# Patient Record
Sex: Male | Born: 1960 | Race: Black or African American | Hispanic: No | Marital: Single | Smoking: Current every day smoker
Health system: Southern US, Community
[De-identification: ages and names within clinical notes are randomized; demographics above are authoritative.]

## PROBLEM LIST (undated history)

## (undated) DIAGNOSIS — L89513 Pressure ulcer of right ankle, stage 3: Secondary | ICD-10-CM

## (undated) DIAGNOSIS — R6510 Systemic inflammatory response syndrome (SIRS) of non-infectious origin without acute organ dysfunction: Secondary | ICD-10-CM

## (undated) DIAGNOSIS — A419 Sepsis, unspecified organism: Secondary | ICD-10-CM

## (undated) DIAGNOSIS — Z930 Tracheostomy status: Secondary | ICD-10-CM

## (undated) DIAGNOSIS — J9690 Respiratory failure, unspecified, unspecified whether with hypoxia or hypercapnia: Secondary | ICD-10-CM

## (undated) DIAGNOSIS — L89314 Pressure ulcer of right buttock, stage 4: Secondary | ICD-10-CM

## (undated) DIAGNOSIS — I469 Cardiac arrest, cause unspecified: Secondary | ICD-10-CM

## (undated) DIAGNOSIS — J15212 Pneumonia due to Methicillin resistant Staphylococcus aureus: Secondary | ICD-10-CM

## (undated) DIAGNOSIS — G8250 Quadriplegia, unspecified: Secondary | ICD-10-CM

## (undated) DIAGNOSIS — S14105A Unspecified injury at C5 level of cervical spinal cord, initial encounter: Secondary | ICD-10-CM

## (undated) DIAGNOSIS — N390 Urinary tract infection, site not specified: Secondary | ICD-10-CM

## (undated) HISTORY — PX: GASTROSTOMY TUBE PLACEMENT: SHX655

## (undated) HISTORY — PX: SPINAL FUSION: SHX223

## (undated) HISTORY — PX: OTHER SURGICAL HISTORY: SHX169

## (undated) HISTORY — PX: SUPRAPUBIC CATHETER INSERTION: SUR719

---

## 2002-10-15 ENCOUNTER — Ambulatory Visit (HOSPITAL_COMMUNITY): Admission: RE | Admit: 2002-10-15 | Discharge: 2002-10-15 | Payer: Self-pay | Admitting: Internal Medicine

## 2002-10-15 ENCOUNTER — Encounter (HOSPITAL_BASED_OUTPATIENT_CLINIC_OR_DEPARTMENT_OTHER): Payer: Self-pay | Admitting: Internal Medicine

## 2002-10-19 ENCOUNTER — Encounter (HOSPITAL_BASED_OUTPATIENT_CLINIC_OR_DEPARTMENT_OTHER): Payer: Self-pay | Admitting: Internal Medicine

## 2002-10-19 ENCOUNTER — Ambulatory Visit (HOSPITAL_COMMUNITY): Admission: RE | Admit: 2002-10-19 | Discharge: 2002-10-19 | Payer: Self-pay | Admitting: Internal Medicine

## 2002-10-21 ENCOUNTER — Emergency Department (HOSPITAL_COMMUNITY): Admission: EM | Admit: 2002-10-21 | Discharge: 2002-10-22 | Payer: Self-pay | Admitting: Emergency Medicine

## 2002-10-22 ENCOUNTER — Encounter: Payer: Self-pay | Admitting: Emergency Medicine

## 2002-11-03 ENCOUNTER — Encounter: Payer: Self-pay | Admitting: Internal Medicine

## 2002-11-03 ENCOUNTER — Ambulatory Visit (HOSPITAL_COMMUNITY): Admission: RE | Admit: 2002-11-03 | Discharge: 2002-11-03 | Payer: Self-pay | Admitting: Internal Medicine

## 2002-11-05 ENCOUNTER — Encounter: Payer: Self-pay | Admitting: Internal Medicine

## 2002-11-05 ENCOUNTER — Ambulatory Visit (HOSPITAL_COMMUNITY): Admission: RE | Admit: 2002-11-05 | Discharge: 2002-11-05 | Payer: Self-pay | Admitting: Internal Medicine

## 2003-11-12 ENCOUNTER — Emergency Department (HOSPITAL_COMMUNITY): Admission: EM | Admit: 2003-11-12 | Discharge: 2003-11-12 | Payer: Self-pay | Admitting: Emergency Medicine

## 2003-11-17 ENCOUNTER — Encounter (INDEPENDENT_AMBULATORY_CARE_PROVIDER_SITE_OTHER): Payer: Self-pay | Admitting: *Deleted

## 2003-11-17 ENCOUNTER — Ambulatory Visit (HOSPITAL_COMMUNITY): Admission: RE | Admit: 2003-11-17 | Discharge: 2003-11-17 | Payer: Self-pay | Admitting: Gastroenterology

## 2005-09-04 ENCOUNTER — Encounter (HOSPITAL_BASED_OUTPATIENT_CLINIC_OR_DEPARTMENT_OTHER): Admission: RE | Admit: 2005-09-04 | Discharge: 2005-12-03 | Payer: Self-pay | Admitting: Surgery

## 2005-11-02 ENCOUNTER — Emergency Department (HOSPITAL_COMMUNITY): Admission: EM | Admit: 2005-11-02 | Discharge: 2005-11-03 | Payer: Self-pay | Admitting: Family Medicine

## 2005-12-05 ENCOUNTER — Encounter (HOSPITAL_BASED_OUTPATIENT_CLINIC_OR_DEPARTMENT_OTHER): Admission: RE | Admit: 2005-12-05 | Discharge: 2006-01-02 | Payer: Self-pay | Admitting: Surgery

## 2006-01-08 ENCOUNTER — Encounter (HOSPITAL_BASED_OUTPATIENT_CLINIC_OR_DEPARTMENT_OTHER): Admission: RE | Admit: 2006-01-08 | Discharge: 2006-03-19 | Payer: Self-pay | Admitting: Surgery

## 2006-02-01 ENCOUNTER — Emergency Department (HOSPITAL_COMMUNITY): Admission: EM | Admit: 2006-02-01 | Discharge: 2006-02-01 | Payer: Self-pay | Admitting: Emergency Medicine

## 2007-03-05 ENCOUNTER — Inpatient Hospital Stay (HOSPITAL_COMMUNITY): Admission: EM | Admit: 2007-03-05 | Discharge: 2007-03-12 | Payer: Self-pay | Admitting: Emergency Medicine

## 2007-04-14 ENCOUNTER — Emergency Department (HOSPITAL_COMMUNITY): Admission: EM | Admit: 2007-04-14 | Discharge: 2007-04-15 | Payer: Self-pay | Admitting: Emergency Medicine

## 2007-04-15 ENCOUNTER — Ambulatory Visit (HOSPITAL_COMMUNITY): Admission: RE | Admit: 2007-04-15 | Discharge: 2007-04-15 | Payer: Self-pay | Admitting: Emergency Medicine

## 2007-04-29 ENCOUNTER — Encounter (HOSPITAL_BASED_OUTPATIENT_CLINIC_OR_DEPARTMENT_OTHER): Admission: RE | Admit: 2007-04-29 | Discharge: 2007-07-28 | Payer: Self-pay | Admitting: Surgery

## 2007-06-24 ENCOUNTER — Inpatient Hospital Stay (HOSPITAL_COMMUNITY): Admission: EM | Admit: 2007-06-24 | Discharge: 2007-06-25 | Payer: Self-pay | Admitting: Emergency Medicine

## 2007-07-11 ENCOUNTER — Observation Stay (HOSPITAL_COMMUNITY): Admission: EM | Admit: 2007-07-11 | Discharge: 2007-07-13 | Payer: Self-pay | Admitting: Emergency Medicine

## 2007-08-07 ENCOUNTER — Encounter (HOSPITAL_BASED_OUTPATIENT_CLINIC_OR_DEPARTMENT_OTHER): Admission: RE | Admit: 2007-08-07 | Discharge: 2007-11-05 | Payer: Self-pay | Admitting: Surgery

## 2007-10-09 ENCOUNTER — Emergency Department (HOSPITAL_COMMUNITY): Admission: EM | Admit: 2007-10-09 | Discharge: 2007-10-09 | Payer: Self-pay | Admitting: Emergency Medicine

## 2007-11-24 ENCOUNTER — Observation Stay (HOSPITAL_COMMUNITY): Admission: EM | Admit: 2007-11-24 | Discharge: 2007-11-25 | Payer: Self-pay | Admitting: Emergency Medicine

## 2008-01-08 ENCOUNTER — Encounter (HOSPITAL_COMMUNITY): Admission: RE | Admit: 2008-01-08 | Discharge: 2008-03-16 | Payer: Self-pay | Admitting: *Deleted

## 2008-01-28 ENCOUNTER — Encounter: Admission: RE | Admit: 2008-01-28 | Discharge: 2008-01-28 | Payer: Self-pay | Admitting: Gastroenterology

## 2008-02-04 ENCOUNTER — Encounter: Admission: RE | Admit: 2008-02-04 | Discharge: 2008-02-04 | Payer: Self-pay | Admitting: Gastroenterology

## 2008-02-05 ENCOUNTER — Emergency Department (HOSPITAL_COMMUNITY): Admission: EM | Admit: 2008-02-05 | Discharge: 2008-02-06 | Payer: Self-pay | Admitting: Emergency Medicine

## 2008-03-22 ENCOUNTER — Emergency Department (HOSPITAL_COMMUNITY): Admission: EM | Admit: 2008-03-22 | Discharge: 2008-03-23 | Payer: Self-pay | Admitting: Emergency Medicine

## 2008-03-23 ENCOUNTER — Inpatient Hospital Stay (HOSPITAL_COMMUNITY): Admission: EM | Admit: 2008-03-23 | Discharge: 2008-03-30 | Payer: Self-pay | Admitting: Emergency Medicine

## 2008-03-24 ENCOUNTER — Ambulatory Visit: Payer: Self-pay | Admitting: Vascular Surgery

## 2008-03-24 ENCOUNTER — Encounter (INDEPENDENT_AMBULATORY_CARE_PROVIDER_SITE_OTHER): Payer: Self-pay | Admitting: Internal Medicine

## 2008-04-26 ENCOUNTER — Inpatient Hospital Stay (HOSPITAL_COMMUNITY): Admission: EM | Admit: 2008-04-26 | Discharge: 2008-05-03 | Payer: Self-pay | Admitting: Emergency Medicine

## 2008-05-20 ENCOUNTER — Emergency Department (HOSPITAL_COMMUNITY): Admission: EM | Admit: 2008-05-20 | Discharge: 2008-05-20 | Payer: Self-pay | Admitting: Emergency Medicine

## 2008-05-31 ENCOUNTER — Emergency Department (HOSPITAL_COMMUNITY): Admission: EM | Admit: 2008-05-31 | Discharge: 2008-05-31 | Payer: Self-pay | Admitting: Emergency Medicine

## 2008-06-09 ENCOUNTER — Inpatient Hospital Stay (HOSPITAL_COMMUNITY): Admission: EM | Admit: 2008-06-09 | Discharge: 2008-06-16 | Payer: Self-pay | Admitting: Emergency Medicine

## 2008-07-16 ENCOUNTER — Ambulatory Visit: Payer: Self-pay | Admitting: Cardiology

## 2008-07-17 ENCOUNTER — Inpatient Hospital Stay (HOSPITAL_COMMUNITY): Admission: EM | Admit: 2008-07-17 | Discharge: 2008-07-29 | Payer: Self-pay | Admitting: Emergency Medicine

## 2008-07-22 ENCOUNTER — Encounter (INDEPENDENT_AMBULATORY_CARE_PROVIDER_SITE_OTHER): Payer: Self-pay | Admitting: Internal Medicine

## 2008-07-31 ENCOUNTER — Inpatient Hospital Stay (HOSPITAL_COMMUNITY): Admission: EM | Admit: 2008-07-31 | Discharge: 2008-08-05 | Payer: Self-pay | Admitting: Emergency Medicine

## 2008-08-04 ENCOUNTER — Encounter (INDEPENDENT_AMBULATORY_CARE_PROVIDER_SITE_OTHER): Payer: Self-pay | Admitting: Internal Medicine

## 2008-09-12 ENCOUNTER — Ambulatory Visit (HOSPITAL_COMMUNITY): Admission: RE | Admit: 2008-09-12 | Discharge: 2008-09-12 | Payer: Self-pay | Admitting: Urology

## 2008-09-20 ENCOUNTER — Emergency Department (HOSPITAL_COMMUNITY): Admission: EM | Admit: 2008-09-20 | Discharge: 2008-09-21 | Payer: Self-pay | Admitting: Emergency Medicine

## 2008-12-28 ENCOUNTER — Ambulatory Visit (HOSPITAL_COMMUNITY): Admission: RE | Admit: 2008-12-28 | Discharge: 2008-12-28 | Payer: Self-pay | Admitting: Internal Medicine

## 2009-03-27 ENCOUNTER — Emergency Department (HOSPITAL_COMMUNITY): Admission: EM | Admit: 2009-03-27 | Discharge: 2009-03-27 | Payer: Self-pay | Admitting: Emergency Medicine

## 2009-03-29 ENCOUNTER — Inpatient Hospital Stay (HOSPITAL_COMMUNITY): Admission: EM | Admit: 2009-03-29 | Discharge: 2009-04-12 | Payer: Self-pay | Admitting: Emergency Medicine

## 2009-04-02 ENCOUNTER — Encounter (INDEPENDENT_AMBULATORY_CARE_PROVIDER_SITE_OTHER): Payer: Self-pay | Admitting: Internal Medicine

## 2010-01-15 IMAGING — CR DG ABDOMEN ACUTE W/ 1V CHEST
1 series · 1 of 1 positions shown · non-contrast
Comparison: Portable chest 07/31/2008.  Abdominal radiographs
01/28/2008.

CLINICAL DATA: Hematuria with abdominal pain and constipation.

ACUTE ABDOMEN SERIES (ABDOMEN 2 VIEW & CHEST 1 VIEW)

[t chest supine]
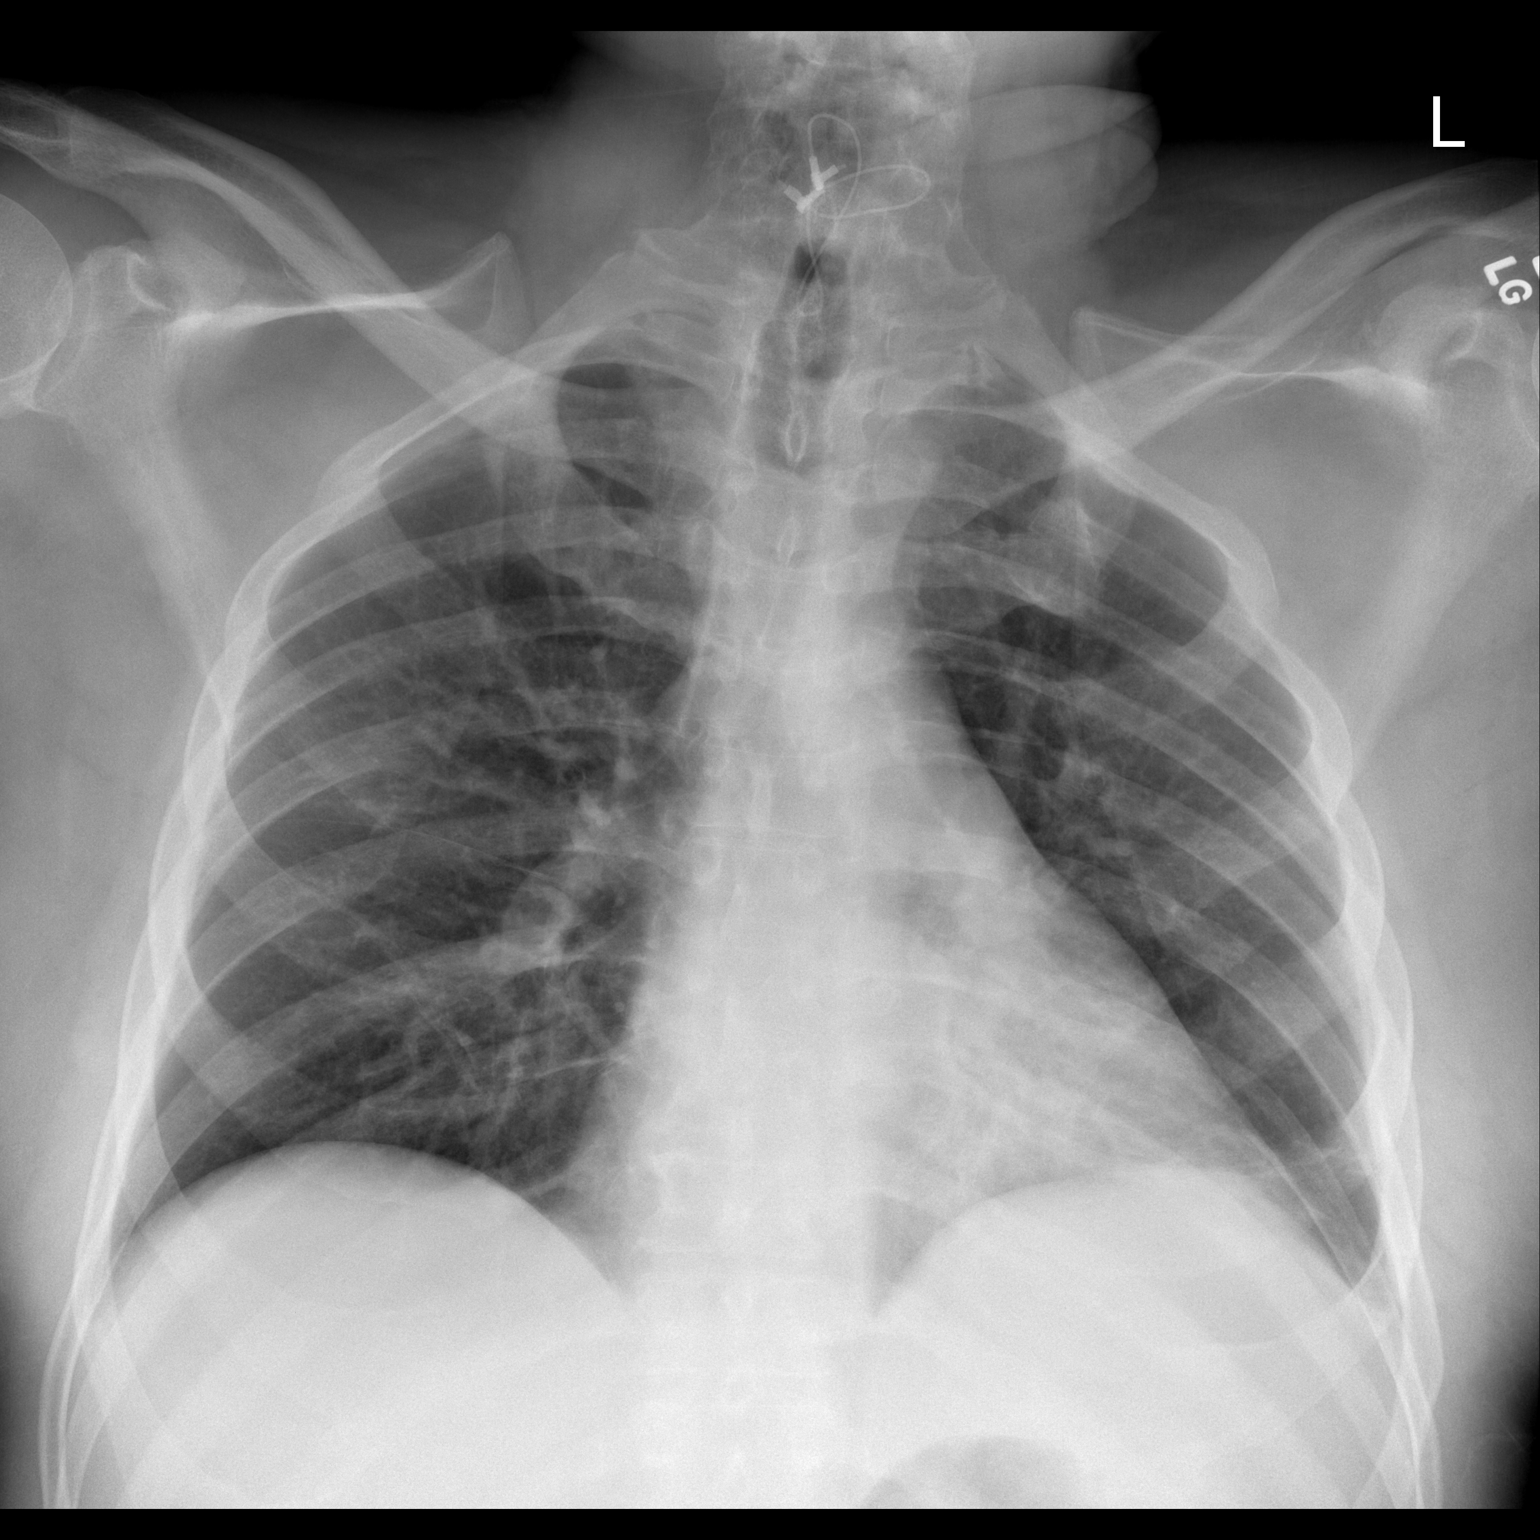

[1 of 1 positions shown; findings below may reference images not displayed]

FINDINGS: Frontal examination of the chest demonstrates stable
cardiomediastinal contours.  There is increased left lower lobe
atelectasis.  No consolidation, edema or significant pleural
effusion is seen.  There has been lower cervical fusion.

Supine and left lateral decubitus views of the abdomen demonstrate
a moderate amount of stool in the colon.  There is mild small and
large bowel distension without evidence of obstruction.  There is
no free intraperitoneal air.  A left pelvic calcification appears
stable, possibly a phlebolith or bladder calculus.  No new
abdominal calcifications are identified.  Lumbar spondylosis
appears stable.
IMPRESSION: 1.  Increased left lower lobe atelectasis.
2.  Nonobstructive bowel gas pattern.
3.  Stable left pelvic calcification.

## 2010-01-16 ENCOUNTER — Encounter (HOSPITAL_BASED_OUTPATIENT_CLINIC_OR_DEPARTMENT_OTHER)
Admission: RE | Admit: 2010-01-16 | Discharge: 2010-03-29 | Payer: Self-pay | Source: Home / Self Care | Attending: General Surgery | Admitting: General Surgery

## 2010-02-15 ENCOUNTER — Inpatient Hospital Stay (HOSPITAL_COMMUNITY)
Admission: EM | Admit: 2010-02-15 | Discharge: 2010-03-02 | Payer: Self-pay | Source: Home / Self Care | Admitting: Emergency Medicine

## 2010-02-19 ENCOUNTER — Encounter (INDEPENDENT_AMBULATORY_CARE_PROVIDER_SITE_OTHER): Payer: Self-pay | Admitting: Internal Medicine

## 2010-02-19 ENCOUNTER — Ambulatory Visit: Payer: Self-pay | Admitting: Vascular Surgery

## 2010-02-19 ENCOUNTER — Ambulatory Visit: Payer: Self-pay | Admitting: Internal Medicine

## 2010-02-20 ENCOUNTER — Ambulatory Visit: Payer: Self-pay | Admitting: Vascular Surgery

## 2010-02-20 ENCOUNTER — Encounter (INDEPENDENT_AMBULATORY_CARE_PROVIDER_SITE_OTHER): Payer: Self-pay | Admitting: Internal Medicine

## 2010-02-21 DIAGNOSIS — F29 Unspecified psychosis not due to a substance or known physiological condition: Secondary | ICD-10-CM

## 2010-02-23 DIAGNOSIS — F29 Unspecified psychosis not due to a substance or known physiological condition: Secondary | ICD-10-CM

## 2010-02-24 DIAGNOSIS — F29 Unspecified psychosis not due to a substance or known physiological condition: Secondary | ICD-10-CM

## 2010-03-04 ENCOUNTER — Inpatient Hospital Stay (HOSPITAL_COMMUNITY)
Admission: EM | Admit: 2010-03-04 | Discharge: 2010-03-23 | Payer: Self-pay | Source: Home / Self Care | Attending: Internal Medicine | Admitting: Internal Medicine

## 2010-03-15 ENCOUNTER — Encounter (INDEPENDENT_AMBULATORY_CARE_PROVIDER_SITE_OTHER): Payer: Self-pay | Admitting: Internal Medicine

## 2010-04-23 IMAGING — CT CT HEAD WO/W CM
1 of 2 series · 13 of 30 positions shown, 17 images · IV contrast (agent unspecified)
Comparison: 07/31/2008

CLINICAL DATA: Hypertension.  Diabetes.  Quadriplegia.  Evaluate
for aneurysm.

CT HEAD WITHOUT AND WITH CONTRAST
TECHNIQUE: Contiguous axial images were obtained from the base of
the skull through the vertex without and with intravenous contrast.
Contrast: 100 ml [id number] IV.

[Series 2: head without 4.8 h37s · axial · non-contrast · 0.45mm/px · z∈[-122,+23]mm · 13 of 36 slices shown, 17 images]
[im 3/36  brain]
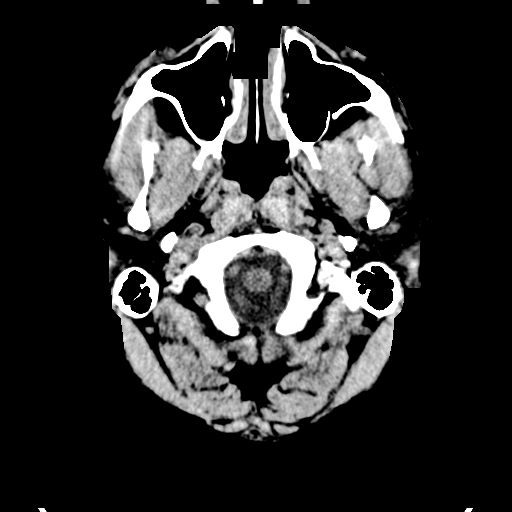
[im 3/36  bone]
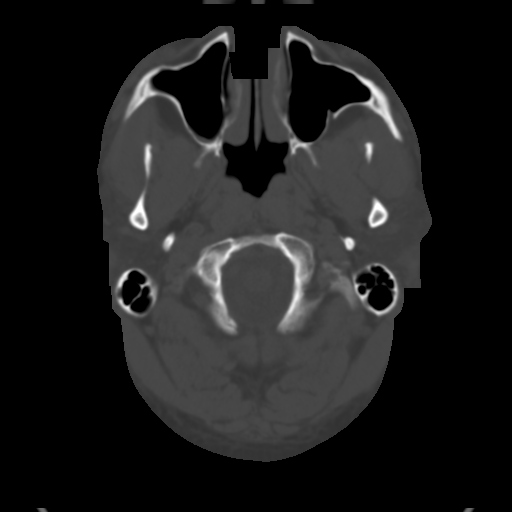
[im 6/36  brain]
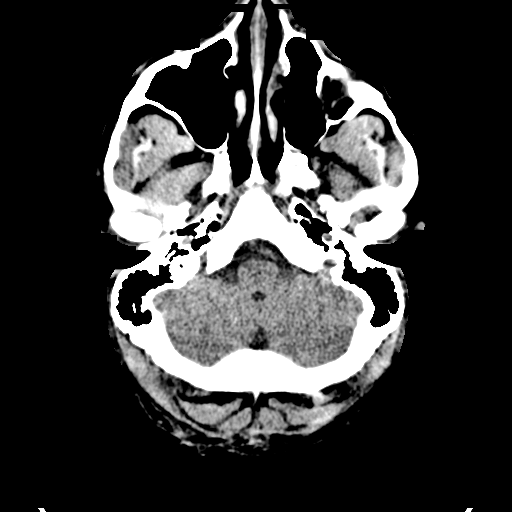
[im 8/36  brain]
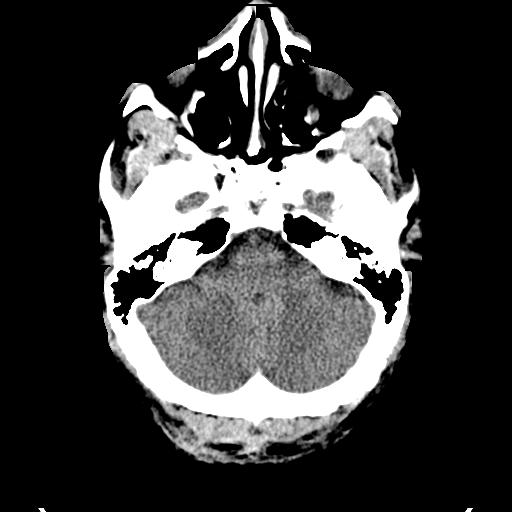
[im 11/36  brain]
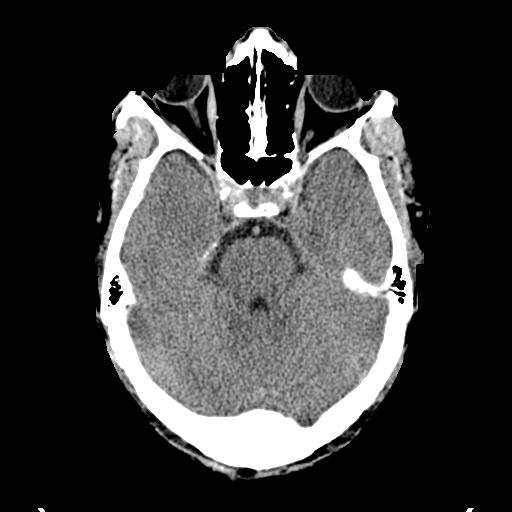
[im 13/36  brain]
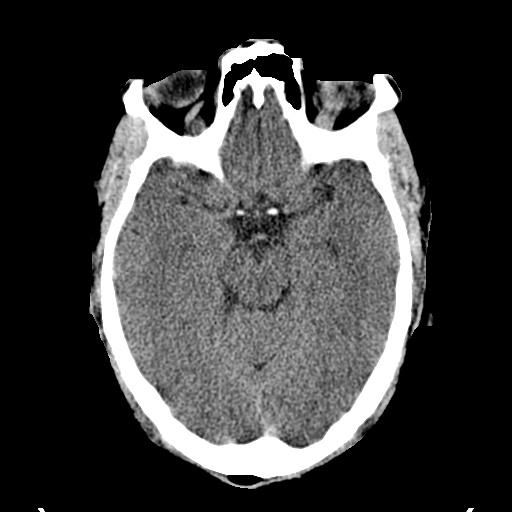
[im 13/36  bone]
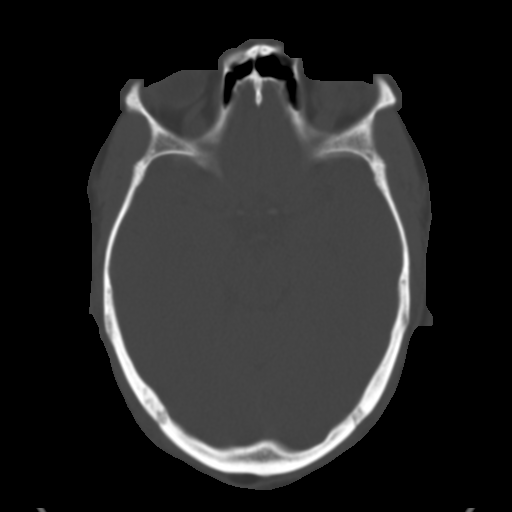
[im 16/36  brain]
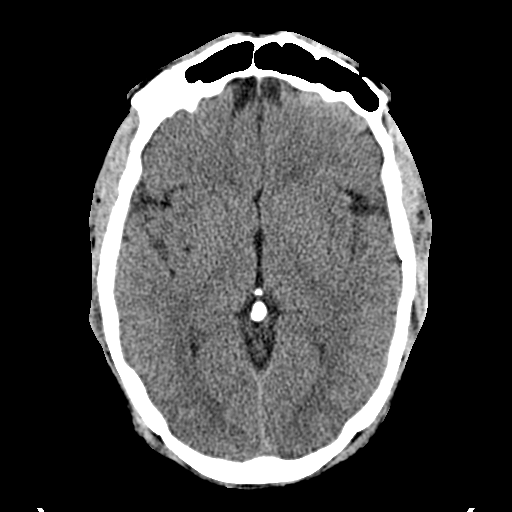
[im 18/36  brain]
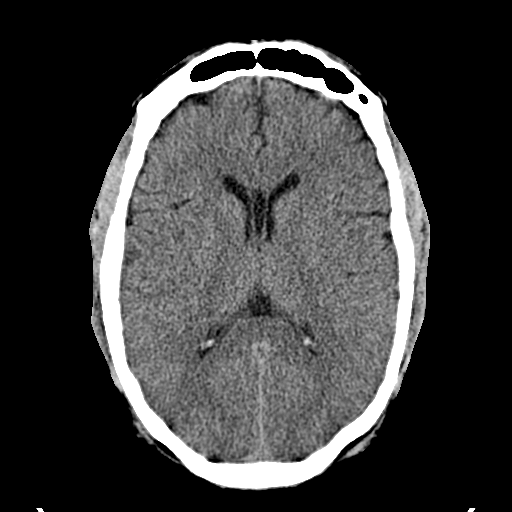
[im 21/36  brain]
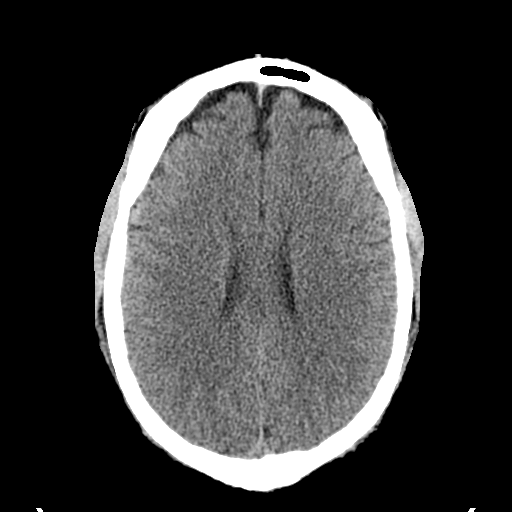
[im 23/36  brain]
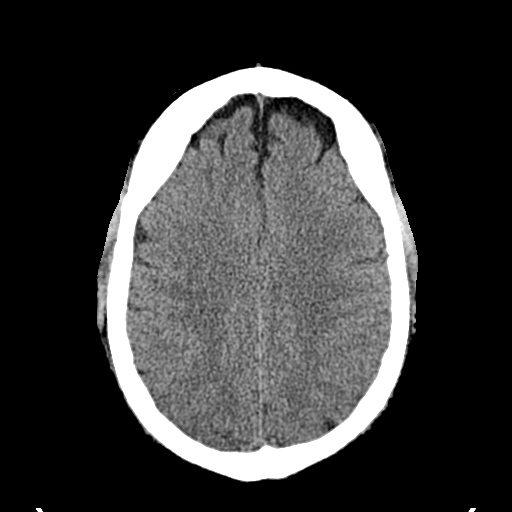
[im 23/36  bone]
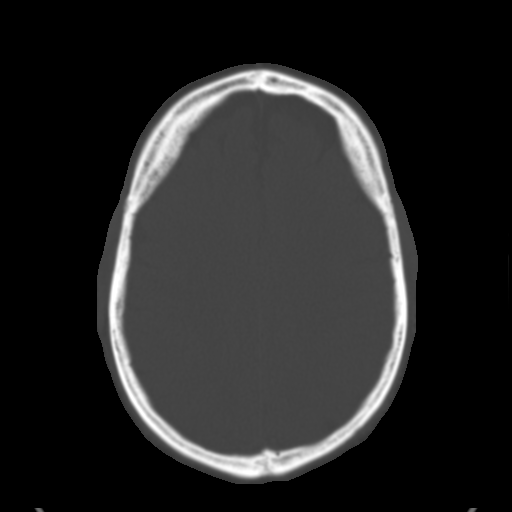
[im 26/36  brain]
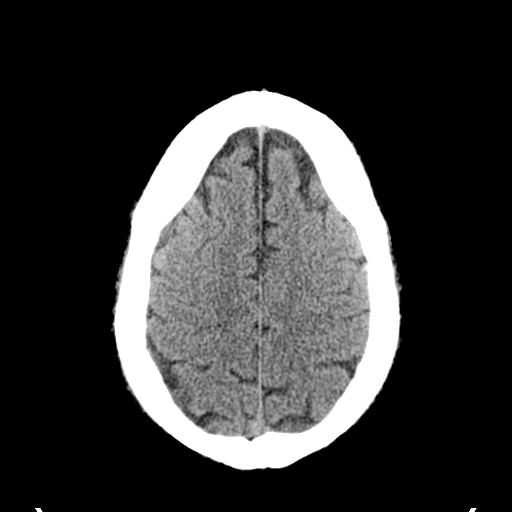
[im 28/36  brain]
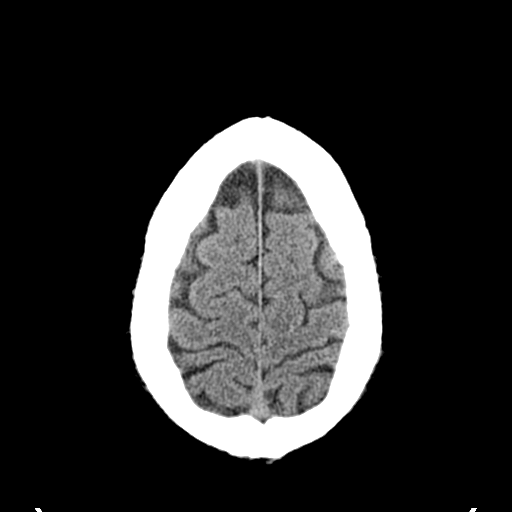
[im 31/36  brain]
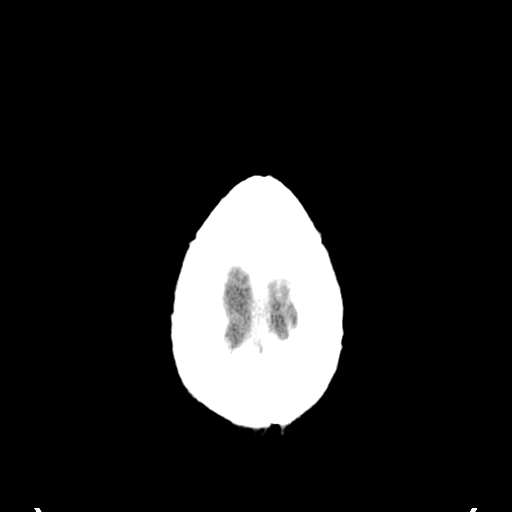
[im 33/36  brain]
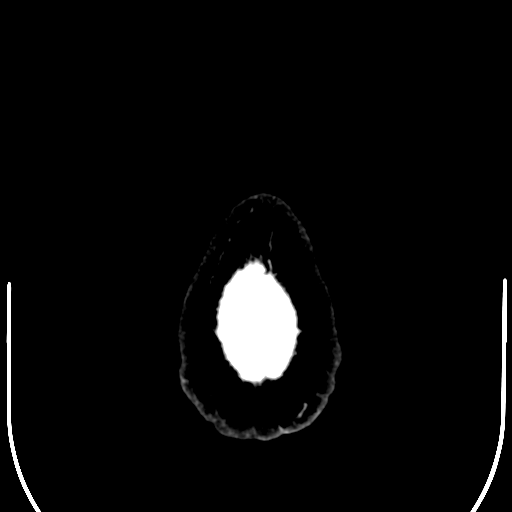
[im 33/36  bone]
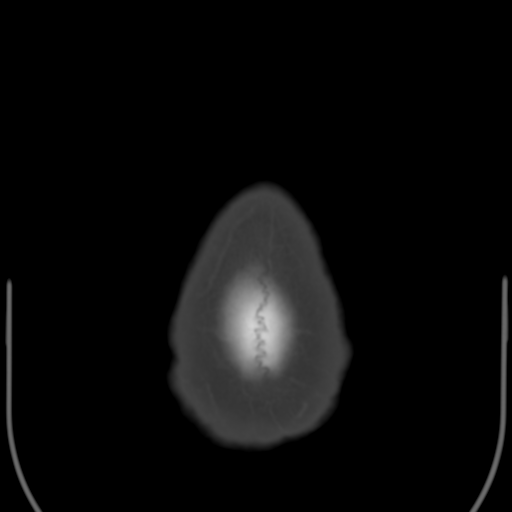

[13 of 30 positions shown; findings below may reference images not displayed]

FINDINGS: No mass, hemorrhage, acute infarction, or hydrocephalus.
No abnormal contrast enhancement.  Patency of the vertebral,
basilar, and internal carotid arteries.  No findings to strongly
suggest the presence of an aneurysm.  Possible remote medial
blowout fracture of the left orbit.
IMPRESSION: No acute intracranial findings.  See comments above.

## 2010-04-27 ENCOUNTER — Emergency Department: Payer: Self-pay | Admitting: Emergency Medicine

## 2010-05-07 ENCOUNTER — Encounter: Admission: RE | Admit: 2010-05-07 | Payer: Self-pay | Source: Home / Self Care

## 2010-05-29 NOTE — H&P (Signed)
NAME:  [name] [name], [name] [name]              ACCOUNT NO.:  [id number]  MEDICAL RECORD NO.:  [id number]          PATIENT TYPE:  INP  LOCATION:  0105                         FACILITY:  [facility]  PHYSICIAN:  Lucile Crater, MD         DATE OF BIRTH:  [birth year]  DATE OF ADMISSION:  02/15/2010 DATE OF DISCHARGE:                             HISTORY & PHYSICAL   CHIEF COMPLAINT:  Fever and abdominal pain.  HISTORY OF PRESENT ILLNESS:  This 50 year old quadriplegic nursing home resident is followed in primary care by Dr. Baltazar Najjar.  He was sent from the nursing facility to the emergency room with a fever of 102.0 rectal, abdominal pain, and abdominal distention.  States he has been having increased abdominal distention and crampy pain per 2 to 3 days. He states he has been having normal bowel movements.  He is being treated for chronic bilateral foot wounds and placed on Septra antibiotic therapy for suspected infected wound to the foot.  He is chronically on Macrodantin UTI prophylaxis with a history of recurrent UTI and suprapubic catheter placement secondary to neurogenic bladder. His systolic blood pressure was noted low on arrival in the 90s.  His fever is confirmed per rectal temperature.  He is tachycardic and he has leukocytosis.  He is admitted to triad hospitalist team #2.  PAST MEDICAL HISTORY: 1. Traumatic quadriplegia. 2. Non-ischemic cardiomyopathy with EF 30%. 3. Neurogenic bladder post suprapubic Foley catheter. 4. History of chronic decubitus ulcers. 5. Prior history of DVT, not currently on anticoagulation. 6. Anemia of chronic disease. 7. Hypertension. 8. Autonomic neuropathy causing blood pressure fluctuations. 9. History of depression. 10.History hyperlipidemia. 11.Prior back surgery, details unknown.  CURRENT MEDICATIONS:  Per med reconciliation: 1. Macrodantin 50 mg twice daily. 2. Probiotic lactobacillus 75 mg daily, listed the patient completed. 3.  Sulfa/trimethoprim 800/160 mg twice daily. 4. Methenamine/mandelate/sodium acid phosphate 500/500 mg 1 tablet     four times daily. 5. Guaifenesin 10 mL every 4 hours as needed. 6. Acetaminophen 30 mg 2 tablets every 6 hours as needed. 7. Benadryl 25 mg every 6 hours as needed. 8. Tylenol 650 mg every 6 hours as needed. 9. Dulcolax 10 mg suppository daily as needed. 10.Vitamin C 500 mg four times daily. 11.Ferrous sulfate 300 mg three times daily. 12.Flexeril 10 mg four times daily. 13.Gabapentin 300 mg four times daily. 14.Lorazepam 1 mg every 4 hours as needed. 15.Maalox 30 mL every 4 hours as needed. 16.Vicodin 5/500 mg every 4 hours as needed. 17.Zanaflex 4 mg twice daily.  ALLERGIES:  NO KNOWN DRUG ALLERGIES.  FAMILY HISTORY:  Father died of old age.  Mother died of MI.  Family propensity for coronary artery disease, CVA, diabetes.  SOCIAL HISTORY:  Has been a resident in a [facility] nursing home.  He smokes 6 cigarettes daily and declines a nicotine patch.  States he was a heavy drinker previously but does not drink currently.  No illicit drugs.  REVIEW OF SYSTEMS:  EYES:  No cataracts or glaucoma.  No visual change, discharge, pain.  EARS: No hearing loss, discharge, pain.  NOSE: No rhinitis or sinusitis.  MOUTH/THROAT:  No oral or dental pain.  No dysphagia.  CARDIAC:  No central chest pain or palpitation.  No history of coronary artery disease.  Does have a history of hypertension. LUNGS:  Denies shortness of breath except when laid flat.  Denies any dyspnea on exertion.  Denies any cough or increased sputum.  ABDOMEN: States he has been having abdominal distention and crampy abdominal pain for 2 to 3 days though this has essentially resolved since his arrival at the nursing home.  States he is not constipated and is only currently having mild abdominal pain.  Urinary genital has a chronic suprapubic catheter due to neurogenic bladder.  Denies any bladder area or  flank pain.  MUSCULOSKELETAL:  Quadriplegia since 1995.  States he has chronic myalgias and arthralgias.  Also chronic problems with muscle spasms. NEUROLOGIC:  Quadriplegia as above.  No history of CVA or seizure. HEMATOLOGIC:  Denies any abnormal bleeding or bruising.  SKIN:  States he has a heel wound that has been under treatment for quite some time. This apparently has gotten somewhat bigger recently in the nursing home per the patient's report.  Also has a chronic right foot lateral wound. He states there was a suspicion of an infection here and he was placed on Septra antibiotic therapy.  He denies any gluteal or back wounds. ENDOCRINE:  Denies diabetes or thyroid disorder.  PHYSICAL EXAMINATION:  VITAL SIGNS:  Blood pressure 93-149 over 55-124. Pulse was 120 apically.  Respiration 20-24.  102.0 oral temperature on admission, current 101.8 rectal.  O2 sats 95% on room air. GENERAL APPEARANCE:  Moderately obese male in no distress.  He is alert, cooperative, oriented. HEENT:  Head normocephalic.  Eyes: Pupils equal and reactive.  Ears: Canals clear and hearing normal to conversational tone.  Nose: Nares patent without discharge.  Oral mucosa pink and moist. NECK: No jugular venous distention, bruits, adenopathy or thyromegaly. The patient does have a scar over the left frontal to lateral neck.  He states was from a cervical spinal surgery. CARDIAC:  Rate and rhythm regular without murmur.  S3, S4.  Absent peripheral pulses, and he has peripheral edema mainly right forefoot below his Unna boot type dressing.  There is a wrinkled appearance to the skin suggesting a prior edema now resolved bilaterally where the Unna boot dressings were present.  There is no calf pain but the patient has no sensation. LUNGS:  Breath sounds are clear and equal.  No distress or cough. Stable O2 sats. ABDOMEN:  Soft and somewhat distended with positive hypotonic bowel sounds in all four quadrants.  No  pain, guarding, rebound tenderness. No percussible fluid in the abdomen.  No organomegaly noted. GENITOURINARY:  No bladder area pain.  Suprapubic catheter with some crusted material around the insertion site but no purulent drainage or erythema to suggest a cellulitis.  Urine is draining to the collection bag and is clear without sediment. MUSCULOSKELETAL:  Does have movement of both upper extremities but with significant weakness and spasticity.  No independent movement of the lower extremities. NEUROLOGIC:  Cranial nerves II-XII grossly intact.  He has some movement in the upper extremities but spastic and weak.  No movement in the lower extremities which are not new findings. HEMATOLOGIC:  No abnormal bleeding or bruising noted. SKIN:  The patient has an approximately 6 to 8-cm in diameter stage III wound with a clean wound bed on the left heel.  Trophic changes to the lower extremities and I cannot palpate a pulse.  No crepitus.  No erythema or purulent drainage seen.  The patient has an unstaged wound on the right lateral foot with a boggy wound bed.  No crepitus noted. No palpable pedal pulses.  No purulent drainage though the appearance around it is somewhat suggestive of dry gangrene.  Trophic changes also noted to that foot and lower leg.  RADIOLOGY AND LABORATORY DATA: 1. Right foot x-ray notes a fifth metatarsal head eroded.     Osteomyelitis may be present. 2. Left foot x-ray notes limited exam with no acute bony pathology.     Chronic changes. 3. CT abdomen and pelvis notes no acute abdominal and pelvic findings.     Diffuse fatty infiltration of the liver.  A 12-mm left renal lesion     is in indeterminate finding but likely a hypodense cyst.  Recommend     followup and abdominal CT scan without and with contrast in 4 to 6     months.  Borderline enlargement external iliac and inguinal lymph     nodes may be related to the patient's suprapubic catheter and     chronic  infection.  X-ray of the abdomen and chest notes no acute     abdominal abnormalities apart from perhaps non-colonic ileus. 4. Stable mild cardiomegaly but no acute cardiopulmonary disease. 5. Urine microscopic 11 to 20 WBCs, RBCs too numerous to count and few     bacteria. 6. CBC with diff notes WBC elevated 13.4, hemoglobin low 11.7,     hematocrit low at 37.1, platelets 182.  Neutrophil absolute     elevated 12.4, and lymphocyte absolute low 0.5. 7. CK high 946, MB 1.9,relativeindex 0.2 and this morning 0.203.     Lactic acid was 0.03. 8. Alcohol less than 5. 9. Comprehensive metabolic panel, sodium 136, potassium 4.1, chloride     101, CO2 26, BUN 70 and creatinine 0.77, glucose elevated 129. 10.GFR greater than 60. 11.Liver function normal but albumin mildly low at 3.4.  IMPRESSION / PLAN: 1. Sepsis and fever with hypotension and tachycardia, unclear source,     urine verses right foot cellulitis:  We will MRI both feet to     evaluate for any osteomyelitis.  Send urine for culture and     sensitivity and blood cultures x2.  We will place to step-down unit     considering low blood pressure and follow closely.  Hydrate with     fluids at 125 cc/hour for 2 liters only and then decrease to 75     cc/hour.  We will need to follow up closely given his prior noted     poor ejection fraction.  We will also culture the right foot wound. 2. Anemia:  We will check stool occult blood x3, and check an anemia     panel.  We will repeat a CBC in a.m. 3. Tachycardia:  Likely secondary to fever and sepsis.  We will     hydrate and follow with telemetry in step-down unit.  We will     obtain 3 sets of cardiac enzymes. 4. Foot wounds:  We will use hydrochloride dressings for now and have     requested a wound care consult.  The patient declines an air     mattress.  We will have staff to elevate feet off the bed to help     alleviate pressure problems there. 5. Rhabdomyolysis:  Total CK  elevated at 964 but otherwise normal     cardiac enzymes.  This is likely secondary to his foot wounds.  We     will MRI to evaluate for osteomyelitis.  Antibiotics with     vancomycin and Zosyn.  We will follow cardiac enzymes including     total CK for 3 sets q.6 hours.  We will also check arterial blood     flow in his legs bilaterally as I suspect he has some significant     peripheral arterial disease. 6. Tobacco smoker:  States he smokes about 6 cigarettes daily.  He     declines a nicotine patch.  Declines cessation. 7. Deep venous thrombosis prophylaxis:  Prior history of DVT listed.     We will use Lovenox 40 mg subcu daily. 8. Code status:  The patient requests full code status.  Admission process 60 minutes.     Everett Graff, N.P.   ______________________________ Lucile Crater, MD    TC/MEDQ  D:  02/16/2010  T:  02/16/2010  Job:  [id number]  cc:   Maxwell Caul, M.D.  Electronically Signed by Everett Graff N.P. on 02/16/2010 07:03:11 PM Electronically Signed by Lucile Crater MD on 05/29/2010 04:20:28 PM

## 2010-06-09 ENCOUNTER — Inpatient Hospital Stay: Payer: Self-pay | Admitting: *Deleted

## 2010-06-18 LAB — BASIC METABOLIC PANEL
BUN: 11 mg/dL (ref 6–23)
BUN: 12 mg/dL (ref 6–23)
BUN: 19 mg/dL (ref 6–23)
CO2: 24 mEq/L (ref 19–32)
Calcium: 9.1 mg/dL (ref 8.4–10.5)
Calcium: 9.3 mg/dL (ref 8.4–10.5)
Calcium: 9.3 mg/dL (ref 8.4–10.5)
Chloride: 99 mEq/L (ref 96–112)
Creatinine, Ser: 0.73 mg/dL (ref 0.4–1.5)
Creatinine, Ser: 0.77 mg/dL (ref 0.4–1.5)
Creatinine, Ser: 0.89 mg/dL (ref 0.4–1.5)
Creatinine, Ser: 1.23 mg/dL (ref 0.4–1.5)
GFR calc Af Amer: >60 mL/min (ref >60)
GFR calc Af Amer: >60 mL/min (ref >60)
GFR calc Af Amer: >60 mL/min (ref >60)
GFR calc non Af Amer: >60 mL/min (ref >60)
GFR calc non Af Amer: >60 mL/min (ref >60)
GFR calc non Af Amer: >60 mL/min (ref >60)
Glucose, Bld: 145 mg/dL — ABNORMAL HIGH (ref 70–99)
Glucose, Bld: 97 mg/dL (ref 70–99)
Sodium: 135 mEq/L (ref 135–145)

## 2010-06-18 LAB — BLOOD GAS, ARTERIAL
Acid-base deficit: 4.6 mmol/L — ABNORMAL HIGH (ref 0.0–2.0)
Bicarbonate: 18.5 mEq/L — ABNORMAL LOW (ref 20.0–24.0)
TCO2: 16.8 mmol/L (ref 0–100)
pCO2 arterial: 29.6 mmHg — ABNORMAL LOW (ref 35.0–45.0)
pH, Arterial: 7.413 (ref 7.350–7.450)
pO2, Arterial: 101 mmHg — ABNORMAL HIGH (ref 80.0–100.0)

## 2010-06-18 LAB — DIFFERENTIAL
Eosinophils Absolute: 0.2 10*3/uL (ref 0.0–0.7)
Eosinophils Relative: 3 % (ref 0–5)
Lymphocytes Relative: 33 % (ref 12–46)
Lymphs Abs: 2.7 10*3/uL (ref 0.7–4.0)
Monocytes Relative: 7 % (ref 3–12)
Neutrophils Relative %: 57 % (ref 43–77)

## 2010-06-18 LAB — GLUCOSE, CAPILLARY: Glucose-Capillary: 250 mg/dL — ABNORMAL HIGH (ref 70–99)

## 2010-06-18 LAB — CBC
MCH: 21.2 pg — ABNORMAL LOW (ref 26.0–34.0)
MCH: 21.3 pg — ABNORMAL LOW (ref 26.0–34.0)
MCHC: 31.4 g/dL (ref 30.0–36.0)
MCHC: 31.4 g/dL (ref 30.0–36.0)
MCHC: 31.5 g/dL (ref 30.0–36.0)
MCV: 68.4 fL — ABNORMAL LOW (ref 78.0–100.0)
Platelets: 288 10*3/uL (ref 150–400)
Platelets: 305 10*3/uL (ref 150–400)
RBC: 4.91 MIL/uL (ref 4.22–5.81)
RBC: 5.11 MIL/uL (ref 4.22–5.81)
RDW: 19.1 % — ABNORMAL HIGH (ref 11.5–15.5)
RDW: 19.1 % — ABNORMAL HIGH (ref 11.5–15.5)
RDW: 19.2 % — ABNORMAL HIGH (ref 11.5–15.5)
RDW: 19.4 % — ABNORMAL HIGH (ref 11.5–15.5)
RDW: 19.5 % — ABNORMAL HIGH (ref 11.5–15.5)
WBC: 8 10*3/uL (ref 4.0–10.5)
WBC: 8.2 10*3/uL (ref 4.0–10.5)

## 2010-06-18 LAB — URINE MICROSCOPIC-ADD ON

## 2010-06-18 LAB — COMPREHENSIVE METABOLIC PANEL
ALT: 15 U/L (ref 0–53)
AST: 23 U/L (ref 0–37)
CO2: 24 mEq/L (ref 19–32)
Calcium: 9.2 mg/dL (ref 8.4–10.5)
Creatinine, Ser: 1.05 mg/dL (ref 0.4–1.5)
GFR calc Af Amer: >60 mL/min (ref >60)
GFR calc non Af Amer: >60 mL/min (ref >60)
Sodium: 136 mEq/L (ref 135–145)
Total Protein: 9.5 g/dL — ABNORMAL HIGH (ref 6.0–8.3)

## 2010-06-18 LAB — URINALYSIS, ROUTINE W REFLEX MICROSCOPIC
Bilirubin Urine: NEGATIVE
Nitrite: POSITIVE — AB
Protein, ur: NEGATIVE mg/dL
Specific Gravity, Urine: 1.022 (ref 1.005–1.030)
Urobilinogen, UA: 1 mg/dL (ref 0.0–1.0)

## 2010-06-18 LAB — CARDIAC PANEL(CRET KIN+CKTOT+MB+TROPI)
Relative Index: 0.4 (ref 0.0–2.5)
Relative Index: 0.4 (ref 0.0–2.5)
Total CK: 390 U/L — ABNORMAL HIGH (ref 7–232)
Total CK: 480 U/L — ABNORMAL HIGH (ref 7–232)
Troponin I: 0.02 ng/mL (ref 0.00–0.06)
Troponin I: 0.02 ng/mL (ref 0.00–0.06)
Troponin I: 0.03 ng/mL (ref 0.00–0.06)

## 2010-06-18 LAB — URINE CULTURE
Colony Count: >=100000
Culture  Setup Time: 201112062100
Special Requests: NEGATIVE

## 2010-06-18 LAB — CK: Total CK: 557 U/L — ABNORMAL HIGH (ref 7–232)

## 2010-06-18 LAB — PROTIME-INR
INR: 1.09 (ref 0.00–1.49)
Prothrombin Time: 14.3 seconds (ref 11.6–15.2)

## 2010-06-18 LAB — TROPONIN I: Troponin I: 0.03 ng/mL (ref 0.00–0.06)

## 2010-06-18 LAB — MAGNESIUM: Magnesium: 2.4 mg/dL (ref 1.5–2.5)

## 2010-06-18 LAB — APTT: aPTT: 34 seconds (ref 24–37)

## 2010-06-18 LAB — D-DIMER, QUANTITATIVE: D-Dimer, Quant: 2.11 ug/mL-FEU — ABNORMAL HIGH (ref 0.00–0.48)

## 2010-06-19 LAB — CBC
HCT: 37.1 % — ABNORMAL LOW (ref 39.0–52.0)
Hemoglobin: 11.1 g/dL — ABNORMAL LOW (ref 13.0–17.0)
Hemoglobin: 11.7 g/dL — ABNORMAL LOW (ref 13.0–17.0)
MCH: 21.6 pg — ABNORMAL LOW (ref 26.0–34.0)
MCH: 21.9 pg — ABNORMAL LOW (ref 26.0–34.0)
MCH: 21.9 pg — ABNORMAL LOW (ref 26.0–34.0)
MCHC: 31.4 g/dL (ref 30.0–36.0)
MCHC: 31.8 g/dL (ref 30.0–36.0)
MCHC: 32 g/dL (ref 30.0–36.0)
MCHC: 32.3 g/dL (ref 30.0–36.0)
MCV: 67.7 fL — ABNORMAL LOW (ref 78.0–100.0)
MCV: 68.9 fL — ABNORMAL LOW (ref 78.0–100.0)
MCV: 69.6 fL — ABNORMAL LOW (ref 78.0–100.0)
Platelets: 153 10*3/uL (ref 150–400)
Platelets: 159 10*3/uL (ref 150–400)
Platelets: 182 10*3/uL (ref 150–400)
Platelets: 211 10*3/uL (ref 150–400)
Platelets: 394 10*3/uL (ref 150–400)
RBC: 5.34 MIL/uL (ref 4.22–5.81)
RDW: 20.6 % — ABNORMAL HIGH (ref 11.5–15.5)
RDW: 20.7 % — ABNORMAL HIGH (ref 11.5–15.5)
RDW: 20.8 % — ABNORMAL HIGH (ref 11.5–15.5)
RDW: 21.4 % — ABNORMAL HIGH (ref 11.5–15.5)
RDW: 21.9 % — ABNORMAL HIGH (ref 11.5–15.5)
WBC: 12.1 10*3/uL — ABNORMAL HIGH (ref 4.0–10.5)
WBC: 13.4 10*3/uL — ABNORMAL HIGH (ref 4.0–10.5)
WBC: 7.8 10*3/uL (ref 4.0–10.5)
WBC: 8.6 10*3/uL (ref 4.0–10.5)

## 2010-06-19 LAB — DIFFERENTIAL
Basophils Absolute: 0 10*3/uL (ref 0.0–0.1)
Basophils Absolute: 0 10*3/uL (ref 0.0–0.1)
Basophils Absolute: 0 K/uL (ref 0.0–0.1)
Basophils Relative: 0 % (ref 0–1)
Eosinophils Absolute: 0 10*3/uL (ref 0.0–0.7)
Eosinophils Absolute: 0 K/uL (ref 0.0–0.7)
Eosinophils Absolute: 0.1 10*3/uL (ref 0.0–0.7)
Eosinophils Relative: 0 % (ref 0–5)
Lymphocytes Relative: 14 % (ref 12–46)
Lymphocytes Relative: 17 % (ref 12–46)
Lymphocytes Relative: 4 % — ABNORMAL LOW (ref 12–46)
Lymphocytes Relative: 5 % — ABNORMAL LOW (ref 12–46)
Lymphs Abs: 0.5 10*3/uL — ABNORMAL LOW (ref 0.7–4.0)
Lymphs Abs: 1.1 10*3/uL (ref 0.7–4.0)
Lymphs Abs: 1.2 10*3/uL (ref 0.7–4.0)
Monocytes Absolute: 0.5 K/uL (ref 0.1–1.0)
Monocytes Absolute: 0.6 10*3/uL (ref 0.1–1.0)
Monocytes Relative: 4 % (ref 3–12)
Monocytes Relative: 7 % (ref 3–12)
Monocytes Relative: 8 % (ref 3–12)
Neutro Abs: 11.1 10*3/uL — ABNORMAL HIGH (ref 1.7–7.7)
Neutro Abs: 12.4 10*3/uL — ABNORMAL HIGH (ref 1.7–7.7)
Neutro Abs: 4.5 10*3/uL (ref 1.7–7.7)
Neutro Abs: 6.7 10*3/uL (ref 1.7–7.7)
Neutrophils Relative %: 92 % — ABNORMAL HIGH (ref 43–77)
Neutrophils Relative %: 92 % — ABNORMAL HIGH (ref 43–77)

## 2010-06-19 LAB — COMPREHENSIVE METABOLIC PANEL
AST: 29 U/L (ref 0–37)
Albumin: 2.8 g/dL — ABNORMAL LOW (ref 3.5–5.2)
Albumin: 3.4 g/dL — ABNORMAL LOW (ref 3.5–5.2)
BUN: 7 mg/dL (ref 6–23)
Calcium: 8.9 mg/dL (ref 8.4–10.5)
Chloride: 101 mEq/L (ref 96–112)
Creatinine, Ser: 0.77 mg/dL (ref 0.4–1.5)
Creatinine, Ser: 0.92 mg/dL (ref 0.4–1.5)
GFR calc Af Amer: >60 mL/min (ref >60)
Sodium: 136 mEq/L (ref 135–145)
Total Bilirubin: 0.5 mg/dL (ref 0.3–1.2)
Total Protein: 7 g/dL (ref 6.0–8.3)

## 2010-06-19 LAB — URINALYSIS, ROUTINE W REFLEX MICROSCOPIC
Bilirubin Urine: NEGATIVE
Glucose, UA: NEGATIVE mg/dL
Ketones, ur: NEGATIVE mg/dL
Nitrite: NEGATIVE
Nitrite: POSITIVE — AB
Protein, ur: 30 mg/dL — AB
Protein, ur: NEGATIVE mg/dL
Specific Gravity, Urine: 1.018 (ref 1.005–1.030)
Specific Gravity, Urine: 1.023 (ref 1.005–1.030)
Urobilinogen, UA: 0.2 mg/dL (ref 0.0–1.0)
Urobilinogen, UA: 1 mg/dL (ref 0.0–1.0)
pH: 8.5 — ABNORMAL HIGH (ref 5.0–8.0)

## 2010-06-19 LAB — FOLATE: Folate: 11.6 ng/mL

## 2010-06-19 LAB — BASIC METABOLIC PANEL
BUN: 8 mg/dL (ref 6–23)
BUN: 9 mg/dL (ref 6–23)
CO2: 25 mEq/L (ref 19–32)
Calcium: 9.1 mg/dL (ref 8.4–10.5)
Chloride: 100 mEq/L (ref 96–112)
Creatinine, Ser: 0.72 mg/dL (ref 0.4–1.5)
GFR calc non Af Amer: >60 mL/min (ref >60)
GFR calc non Af Amer: >60 mL/min (ref >60)
GFR calc non Af Amer: >60 mL/min (ref >60)
GFR calc non Af Amer: >60 mL/min (ref >60)
Glucose, Bld: 108 mg/dL — ABNORMAL HIGH (ref 70–99)
Glucose, Bld: 120 mg/dL — ABNORMAL HIGH (ref 70–99)
Glucose, Bld: 181 mg/dL — ABNORMAL HIGH (ref 70–99)
Potassium: 3.7 mEq/L (ref 3.5–5.1)
Potassium: 3.8 mEq/L (ref 3.5–5.1)
Potassium: 4.1 mEq/L (ref 3.5–5.1)
Sodium: 135 mEq/L (ref 135–145)
Sodium: 137 mEq/L (ref 135–145)
Sodium: 139 mEq/L (ref 135–145)

## 2010-06-19 LAB — CULTURE, BLOOD (ROUTINE X 2)
Culture  Setup Time: 201111110025
Culture  Setup Time: 201111110025
Culture: NO GROWTH
Culture: NO GROWTH

## 2010-06-19 LAB — COMPREHENSIVE METABOLIC PANEL WITH GFR
ALT: 14 U/L (ref 0–53)
Alkaline Phosphatase: 68 U/L (ref 39–117)
BUN: 7 mg/dL (ref 6–23)
CO2: 26 meq/L (ref 19–32)
GFR calc non Af Amer: >60 mL/min (ref >60)
Glucose, Bld: 129 mg/dL — ABNORMAL HIGH (ref 70–99)
Potassium: 4.1 meq/L (ref 3.5–5.1)
Total Protein: 7.3 g/dL (ref 6.0–8.3)

## 2010-06-19 LAB — CARDIAC PANEL(CRET KIN+CKTOT+MB+TROPI)
CK, MB: 0.8 ng/mL (ref 0.3–4.0)
CK, MB: 1.1 ng/mL (ref 0.3–4.0)
Relative Index: 0.1 (ref 0.0–2.5)
Total CK: 677 U/L — ABNORMAL HIGH (ref 7–232)
Total CK: 745 U/L — ABNORMAL HIGH (ref 7–232)
Total CK: 855 U/L — ABNORMAL HIGH (ref 7–232)
Troponin I: 0.02 ng/mL (ref 0.00–0.06)
Troponin I: 0.02 ng/mL (ref 0.00–0.06)

## 2010-06-19 LAB — RETICULOCYTES: RBC.: 5.2 MIL/uL (ref 4.22–5.81)

## 2010-06-19 LAB — TROPONIN I: Troponin I: 0.03 ng/mL (ref 0.00–0.06)

## 2010-06-19 LAB — URINE MICROSCOPIC-ADD ON

## 2010-06-19 LAB — URINE CULTURE
Colony Count: 75000
Colony Count: >=100000
Culture  Setup Time: 201111110046
Culture  Setup Time: 201111191942
Special Requests: NEGATIVE

## 2010-06-19 LAB — CK TOTAL AND CKMB (NOT AT ARMC)
CK, MB: 1.9 ng/mL (ref 0.3–4.0)
Relative Index: 0.2 (ref 0.0–2.5)
Total CK: 946 U/L — ABNORMAL HIGH (ref 7–232)

## 2010-06-19 LAB — PROTIME-INR
INR: 1.22 (ref 0.00–1.49)
Prothrombin Time: 15.6 seconds — ABNORMAL HIGH (ref 11.6–15.2)

## 2010-06-19 LAB — IRON AND TIBC: Iron: <10 ug/dL — ABNORMAL LOW (ref 42–135)

## 2010-06-19 LAB — WOUND CULTURE

## 2010-06-19 LAB — LACTIC ACID, PLASMA: Lactic Acid, Venous: <0.3 mmol/L — ABNORMAL LOW (ref 0.5–2.2)

## 2010-06-19 LAB — APTT: aPTT: 38 seconds — ABNORMAL HIGH (ref 24–37)

## 2010-06-19 LAB — ETHANOL: Alcohol, Ethyl (B): <5 mg/dL (ref 0–10)

## 2010-06-21 ENCOUNTER — Encounter: Payer: Self-pay | Admitting: Cardiothoracic Surgery

## 2010-06-21 ENCOUNTER — Ambulatory Visit: Payer: Self-pay | Admitting: Oncology

## 2010-07-08 ENCOUNTER — Ambulatory Visit: Payer: Self-pay | Admitting: Oncology

## 2010-07-09 LAB — GLUCOSE, CAPILLARY
Glucose-Capillary: 100 mg/dL — ABNORMAL HIGH (ref 70–99)
Glucose-Capillary: 101 mg/dL — ABNORMAL HIGH (ref 70–99)
Glucose-Capillary: 102 mg/dL — ABNORMAL HIGH (ref 70–99)
Glucose-Capillary: 102 mg/dL — ABNORMAL HIGH (ref 70–99)
Glucose-Capillary: 103 mg/dL — ABNORMAL HIGH (ref 70–99)
Glucose-Capillary: 105 mg/dL — ABNORMAL HIGH (ref 70–99)
Glucose-Capillary: 105 mg/dL — ABNORMAL HIGH (ref 70–99)
Glucose-Capillary: 110 mg/dL — ABNORMAL HIGH (ref 70–99)
Glucose-Capillary: 111 mg/dL — ABNORMAL HIGH (ref 70–99)
Glucose-Capillary: 111 mg/dL — ABNORMAL HIGH (ref 70–99)
Glucose-Capillary: 114 mg/dL — ABNORMAL HIGH (ref 70–99)
Glucose-Capillary: 118 mg/dL — ABNORMAL HIGH (ref 70–99)
Glucose-Capillary: 122 mg/dL — ABNORMAL HIGH (ref 70–99)
Glucose-Capillary: 124 mg/dL — ABNORMAL HIGH (ref 70–99)
Glucose-Capillary: 126 mg/dL — ABNORMAL HIGH (ref 70–99)
Glucose-Capillary: 127 mg/dL — ABNORMAL HIGH (ref 70–99)
Glucose-Capillary: 127 mg/dL — ABNORMAL HIGH (ref 70–99)
Glucose-Capillary: 130 mg/dL — ABNORMAL HIGH (ref 70–99)
Glucose-Capillary: 131 mg/dL — ABNORMAL HIGH (ref 70–99)
Glucose-Capillary: 133 mg/dL — ABNORMAL HIGH (ref 70–99)
Glucose-Capillary: 135 mg/dL — ABNORMAL HIGH (ref 70–99)
Glucose-Capillary: 139 mg/dL — ABNORMAL HIGH (ref 70–99)
Glucose-Capillary: 142 mg/dL — ABNORMAL HIGH (ref 70–99)

## 2010-07-09 LAB — DIFFERENTIAL
Eosinophils Absolute: 0 10*3/uL (ref 0.0–0.7)
Eosinophils Absolute: 0 10*3/uL (ref 0.0–0.7)
Eosinophils Relative: 0 % (ref 0–5)
Eosinophils Relative: 0 % (ref 0–5)
Lymphs Abs: 0.6 10*3/uL — ABNORMAL LOW (ref 0.7–4.0)
Lymphs Abs: 0.9 10*3/uL (ref 0.7–4.0)
Monocytes Absolute: 0.3 10*3/uL (ref 0.1–1.0)
Monocytes Relative: 3 % (ref 3–12)
Monocytes Relative: 3 % (ref 3–12)

## 2010-07-09 LAB — POCT CARDIAC MARKERS
CKMB, poc: 2 ng/mL (ref 1.0–8.0)
Myoglobin, poc: >500 ng/mL (ref 12–200)
Troponin i, poc: <0.05 ng/mL (ref 0.00–0.09)
Troponin i, poc: <0.05 ng/mL (ref 0.00–0.09)

## 2010-07-09 LAB — CBC
HCT: 22.6 % — ABNORMAL LOW (ref 39.0–52.0)
HCT: 25.9 % — ABNORMAL LOW (ref 39.0–52.0)
HCT: 26 % — ABNORMAL LOW (ref 39.0–52.0)
HCT: 26.7 % — ABNORMAL LOW (ref 39.0–52.0)
HCT: 27.6 % — ABNORMAL LOW (ref 39.0–52.0)
HCT: 30.4 % — ABNORMAL LOW (ref 39.0–52.0)
Hemoglobin: 10.3 g/dL — ABNORMAL LOW (ref 13.0–17.0)
Hemoglobin: 6.2 g/dL — CL (ref 13.0–17.0)
Hemoglobin: 7.5 g/dL — ABNORMAL LOW (ref 13.0–17.0)
Hemoglobin: 8.2 g/dL — ABNORMAL LOW (ref 13.0–17.0)
Hemoglobin: 8.4 g/dL — ABNORMAL LOW (ref 13.0–17.0)
Hemoglobin: 8.9 g/dL — ABNORMAL LOW (ref 13.0–17.0)
Hemoglobin: 9 g/dL — ABNORMAL LOW (ref 13.0–17.0)
MCHC: 31.8 g/dL (ref 30.0–36.0)
MCHC: 32.1 g/dL (ref 30.0–36.0)
MCHC: 32.5 g/dL (ref 30.0–36.0)
MCHC: 32.5 g/dL (ref 30.0–36.0)
MCHC: 32.8 g/dL (ref 30.0–36.0)
MCHC: 33.1 g/dL (ref 30.0–36.0)
MCHC: 33.1 g/dL (ref 30.0–36.0)
MCHC: 33.2 g/dL (ref 30.0–36.0)
MCV: 74.7 fL — ABNORMAL LOW (ref 78.0–100.0)
MCV: 76.2 fL — ABNORMAL LOW (ref 78.0–100.0)
MCV: 77.2 fL — ABNORMAL LOW (ref 78.0–100.0)
MCV: 77.3 fL — ABNORMAL LOW (ref 78.0–100.0)
MCV: 77.6 fL — ABNORMAL LOW (ref 78.0–100.0)
Platelets: 171 10*3/uL (ref 150–400)
Platelets: 184 10*3/uL (ref 150–400)
Platelets: 186 10*3/uL (ref 150–400)
Platelets: 192 10*3/uL (ref 150–400)
Platelets: 257 10*3/uL (ref 150–400)
Platelets: 278 10*3/uL (ref 150–400)
Platelets: 369 10*3/uL (ref 150–400)
Platelets: 396 10*3/uL (ref 150–400)
RBC: 2.54 MIL/uL — ABNORMAL LOW (ref 4.22–5.81)
RBC: 3.5 MIL/uL — ABNORMAL LOW (ref 4.22–5.81)
RBC: 3.87 MIL/uL — ABNORMAL LOW (ref 4.22–5.81)
RBC: 3.91 MIL/uL — ABNORMAL LOW (ref 4.22–5.81)
RDW: 17.2 % — ABNORMAL HIGH (ref 11.5–15.5)
RDW: 17.8 % — ABNORMAL HIGH (ref 11.5–15.5)
RDW: 18 % — ABNORMAL HIGH (ref 11.5–15.5)
RDW: 18.1 % — ABNORMAL HIGH (ref 11.5–15.5)
RDW: 18.3 % — ABNORMAL HIGH (ref 11.5–15.5)
RDW: 18.8 % — ABNORMAL HIGH (ref 11.5–15.5)
RDW: 18.8 % — ABNORMAL HIGH (ref 11.5–15.5)
RDW: 19.4 % — ABNORMAL HIGH (ref 11.5–15.5)
RDW: 19.9 % — ABNORMAL HIGH (ref 11.5–15.5)
WBC: 10.3 10*3/uL (ref 4.0–10.5)
WBC: 11.5 10*3/uL — ABNORMAL HIGH (ref 4.0–10.5)
WBC: 12.2 10*3/uL — ABNORMAL HIGH (ref 4.0–10.5)
WBC: 12.3 10*3/uL — ABNORMAL HIGH (ref 4.0–10.5)
WBC: 16 10*3/uL — ABNORMAL HIGH (ref 4.0–10.5)
WBC: 8.2 10*3/uL (ref 4.0–10.5)
WBC: 8.2 10*3/uL (ref 4.0–10.5)
WBC: 9.6 10*3/uL (ref 4.0–10.5)

## 2010-07-09 LAB — CROSSMATCH
Antibody Screen: NEGATIVE
Antibody Screen: NEGATIVE

## 2010-07-09 LAB — BASIC METABOLIC PANEL
BUN: 4 mg/dL — ABNORMAL LOW (ref 6–23)
BUN: 5 mg/dL — ABNORMAL LOW (ref 6–23)
BUN: 9 mg/dL (ref 6–23)
CO2: 22 mEq/L (ref 19–32)
CO2: 22 mEq/L (ref 19–32)
CO2: 25 mEq/L (ref 19–32)
Calcium: 7 mg/dL — ABNORMAL LOW (ref 8.4–10.5)
Calcium: 7.8 mg/dL — ABNORMAL LOW (ref 8.4–10.5)
Calcium: 8.1 mg/dL — ABNORMAL LOW (ref 8.4–10.5)
Calcium: 8.3 mg/dL — ABNORMAL LOW (ref 8.4–10.5)
Chloride: 104 mEq/L (ref 96–112)
Chloride: 106 mEq/L (ref 96–112)
Chloride: 108 mEq/L (ref 96–112)
Chloride: 109 mEq/L (ref 96–112)
Creatinine, Ser: 0.91 mg/dL (ref 0.4–1.5)
Creatinine, Ser: 0.96 mg/dL (ref 0.4–1.5)
GFR calc Af Amer: >60 mL/min (ref >60)
GFR calc Af Amer: >60 mL/min (ref >60)
GFR calc non Af Amer: >60 mL/min (ref >60)
GFR calc non Af Amer: >60 mL/min (ref >60)
Glucose, Bld: 116 mg/dL — ABNORMAL HIGH (ref 70–99)
Glucose, Bld: 117 mg/dL — ABNORMAL HIGH (ref 70–99)
Glucose, Bld: 118 mg/dL — ABNORMAL HIGH (ref 70–99)
Glucose, Bld: 154 mg/dL — ABNORMAL HIGH (ref 70–99)
Glucose, Bld: 92 mg/dL (ref 70–99)
Potassium: 3.2 mEq/L — ABNORMAL LOW (ref 3.5–5.1)
Potassium: 3.5 mEq/L (ref 3.5–5.1)
Sodium: 135 mEq/L (ref 135–145)
Sodium: 136 mEq/L (ref 135–145)
Sodium: 136 mEq/L (ref 135–145)
Sodium: 136 mEq/L (ref 135–145)

## 2010-07-09 LAB — CARDIAC PANEL(CRET KIN+CKTOT+MB+TROPI)
CK, MB: 3.2 ng/mL (ref 0.3–4.0)
CK, MB: 3.7 ng/mL (ref 0.3–4.0)
Relative Index: 0.2 (ref 0.0–2.5)
Relative Index: 0.2 (ref 0.0–2.5)
Total CK: 1150 U/L — ABNORMAL HIGH (ref 7–232)
Total CK: 1480 U/L — ABNORMAL HIGH (ref 7–232)
Total CK: 1537 U/L — ABNORMAL HIGH (ref 7–232)
Troponin I: 0.25 ng/mL — ABNORMAL HIGH (ref 0.00–0.06)
Troponin I: 0.27 ng/mL — ABNORMAL HIGH (ref 0.00–0.06)
Troponin I: 0.31 ng/mL — ABNORMAL HIGH (ref 0.00–0.06)
Troponin I: 0.36 ng/mL — ABNORMAL HIGH (ref 0.00–0.06)

## 2010-07-09 LAB — URINE MICROSCOPIC-ADD ON

## 2010-07-09 LAB — URINALYSIS, ROUTINE W REFLEX MICROSCOPIC
Glucose, UA: NEGATIVE mg/dL
Ketones, ur: NEGATIVE mg/dL
Nitrite: NEGATIVE
Specific Gravity, Urine: 1.018 (ref 1.005–1.030)
Specific Gravity, Urine: 1.028 (ref 1.005–1.030)
pH: 5 (ref 5.0–8.0)
pH: 8.5 — ABNORMAL HIGH (ref 5.0–8.0)

## 2010-07-09 LAB — POCT I-STAT, CHEM 8
BUN: 13 mg/dL (ref 6–23)
BUN: 39 mg/dL — ABNORMAL HIGH (ref 6–23)
Calcium, Ion: 0.97 mmol/L — ABNORMAL LOW (ref 1.12–1.32)
Calcium, Ion: 1.15 mmol/L (ref 1.12–1.32)
Chloride: 102 mEq/L (ref 96–112)
Chloride: 103 mEq/L (ref 96–112)
Glucose, Bld: 140 mg/dL — ABNORMAL HIGH (ref 70–99)

## 2010-07-09 LAB — COMPREHENSIVE METABOLIC PANEL
ALT: 14 U/L (ref 0–53)
Albumin: 2.1 g/dL — ABNORMAL LOW (ref 3.5–5.2)
Alkaline Phosphatase: 69 U/L (ref 39–117)
Glucose, Bld: 128 mg/dL — ABNORMAL HIGH (ref 70–99)
Potassium: 4.1 mEq/L (ref 3.5–5.1)
Sodium: 134 mEq/L — ABNORMAL LOW (ref 135–145)
Total Protein: 6.6 g/dL (ref 6.0–8.3)

## 2010-07-09 LAB — URINE CULTURE

## 2010-07-09 LAB — RAPID URINE DRUG SCREEN, HOSP PERFORMED
Amphetamines: NOT DETECTED
Barbiturates: NOT DETECTED
Barbiturates: NOT DETECTED
Benzodiazepines: NOT DETECTED
Opiates: POSITIVE — AB

## 2010-07-09 LAB — HEMOGLOBIN AND HEMATOCRIT, BLOOD
HCT: 26.2 % — ABNORMAL LOW (ref 39.0–52.0)
HCT: 26.9 % — ABNORMAL LOW (ref 39.0–52.0)
HCT: 28.8 % — ABNORMAL LOW (ref 39.0–52.0)
HCT: 29 % — ABNORMAL LOW (ref 39.0–52.0)
Hemoglobin: 8.8 g/dL — ABNORMAL LOW (ref 13.0–17.0)
Hemoglobin: 9.5 g/dL — ABNORMAL LOW (ref 13.0–17.0)
Hemoglobin: 9.7 g/dL — ABNORMAL LOW (ref 13.0–17.0)

## 2010-07-09 LAB — POCT I-STAT 3, ART BLOOD GAS (G3+)
Bicarbonate: 21.8 mEq/L (ref 20.0–24.0)
TCO2: 23 mmol/L (ref 0–100)
pCO2 arterial: 32 mmHg — ABNORMAL LOW (ref 35.0–45.0)
pH, Arterial: 7.442 (ref 7.350–7.450)

## 2010-07-09 LAB — HEPATIC FUNCTION PANEL
ALT: 13 U/L (ref 0–53)
AST: 33 U/L (ref 0–37)
Albumin: 2.4 g/dL — ABNORMAL LOW (ref 3.5–5.2)
Alkaline Phosphatase: 62 U/L (ref 39–117)
Total Bilirubin: 0.7 mg/dL (ref 0.3–1.2)
Total Protein: 6 g/dL (ref 6.0–8.3)

## 2010-07-09 LAB — CULTURE, BLOOD (ROUTINE X 2): Culture: NO GROWTH

## 2010-07-09 LAB — PROTIME-INR: Prothrombin Time: 16.1 seconds — ABNORMAL HIGH (ref 11.6–15.2)

## 2010-07-09 LAB — CK TOTAL AND CKMB (NOT AT ARMC)
CK, MB: 3.3 ng/mL (ref 0.3–4.0)
Relative Index: 0.4 (ref 0.0–2.5)

## 2010-07-16 LAB — BASIC METABOLIC PANEL
BUN: 20 mg/dL (ref 6–23)
BUN: 9 mg/dL (ref 6–23)
CO2: 23 mEq/L (ref 19–32)
CO2: 29 mEq/L (ref 19–32)
Chloride: 101 mEq/L (ref 96–112)
Creatinine, Ser: 1.41 mg/dL (ref 0.4–1.5)
GFR calc non Af Amer: >60 mL/min (ref >60)
Glucose, Bld: 107 mg/dL — ABNORMAL HIGH (ref 70–99)
Potassium: 3.8 mEq/L (ref 3.5–5.1)

## 2010-07-16 LAB — URINALYSIS, ROUTINE W REFLEX MICROSCOPIC
Nitrite: POSITIVE — AB
Specific Gravity, Urine: 1.017 (ref 1.005–1.030)
Urobilinogen, UA: 0.2 mg/dL (ref 0.0–1.0)

## 2010-07-16 LAB — PROTIME-INR: INR: 1 (ref 0.00–1.49)

## 2010-07-16 LAB — DIFFERENTIAL
Basophils Relative: 0 % (ref 0–1)
Eosinophils Absolute: 0.1 10*3/uL (ref 0.0–0.7)
Monocytes Absolute: 0.1 10*3/uL (ref 0.1–1.0)
Neutrophils Relative %: 82 % — ABNORMAL HIGH (ref 43–77)

## 2010-07-16 LAB — APTT: aPTT: 36 seconds (ref 24–37)

## 2010-07-16 LAB — GLUCOSE, CAPILLARY: Glucose-Capillary: 112 mg/dL — ABNORMAL HIGH (ref 70–99)

## 2010-07-16 LAB — CBC
MCHC: 30.9 g/dL (ref 30.0–36.0)
MCV: 70.6 fL — ABNORMAL LOW (ref 78.0–100.0)
Platelets: 321 10*3/uL (ref 150–400)
RBC: 5.61 MIL/uL (ref 4.22–5.81)

## 2010-07-16 LAB — URINE MICROSCOPIC-ADD ON

## 2010-07-16 LAB — URINE CULTURE

## 2010-07-18 LAB — DIFFERENTIAL
Basophils Absolute: 0 10*3/uL (ref 0.0–0.1)
Basophils Absolute: 0 10*3/uL (ref 0.0–0.1)
Basophils Absolute: 0.1 10*3/uL (ref 0.0–0.1)
Basophils Absolute: 0 10*3/uL (ref 0.0–0.1)
Basophils Absolute: 0 10*3/uL (ref 0.0–0.1)
Basophils Relative: 1 % (ref 0–1)
Basophils Relative: 1 % (ref 0–1)
Basophils Relative: 1 % (ref 0–1)
Basophils Relative: 0 % (ref 0–1)
Basophils Relative: 0 % (ref 0–1)
Basophils Relative: 0 % (ref 0–1)
Eosinophils Absolute: 0.1 10*3/uL (ref 0.0–0.7)
Eosinophils Absolute: 0.2 10*3/uL (ref 0.0–0.7)
Eosinophils Absolute: 0.2 10*3/uL (ref 0.0–0.7)
Eosinophils Absolute: 0.2 10*3/uL (ref 0.0–0.7)
Eosinophils Absolute: 0.2 10*3/uL (ref 0.0–0.7)
Eosinophils Absolute: 0.2 10*3/uL (ref 0.0–0.7)
Eosinophils Absolute: 0.2 10*3/uL (ref 0.0–0.7)
Eosinophils Absolute: 0.2 10*3/uL (ref 0.0–0.7)
Eosinophils Absolute: 0.2 10*3/uL (ref 0.0–0.7)
Eosinophils Absolute: 0.2 10*3/uL (ref 0.0–0.7)
Eosinophils Relative: 2 % (ref 0–5)
Eosinophils Relative: 2 % (ref 0–5)
Eosinophils Relative: 2 % (ref 0–5)
Eosinophils Relative: 3 % (ref 0–5)
Eosinophils Relative: 3 % (ref 0–5)
Eosinophils Relative: 3 % (ref 0–5)
Eosinophils Relative: 2 % (ref 0–5)
Eosinophils Relative: 2 % (ref 0–5)
Eosinophils Relative: 3 % (ref 0–5)
Lymphocytes Relative: 25 % (ref 12–46)
Lymphocytes Relative: 28 % (ref 12–46)
Lymphocytes Relative: 31 % (ref 12–46)
Lymphocytes Relative: 33 % (ref 12–46)
Lymphocytes Relative: 39 % (ref 12–46)
Lymphocytes Relative: 11 % — ABNORMAL LOW (ref 12–46)
Lymphocytes Relative: 20 % (ref 12–46)
Lymphocytes Relative: 23 % (ref 12–46)
Lymphs Abs: 1.7 10*3/uL (ref 0.7–4.0)
Lymphs Abs: 2 10*3/uL (ref 0.7–4.0)
Lymphs Abs: 2 10*3/uL (ref 0.7–4.0)
Lymphs Abs: 2.2 10*3/uL (ref 0.7–4.0)
Lymphs Abs: 2.4 10*3/uL (ref 0.7–4.0)
Lymphs Abs: 2.1 10*3/uL (ref 0.7–4.0)
Lymphs Abs: 1.5 10*3/uL (ref 0.7–4.0)
Lymphs Abs: 1.8 10*3/uL (ref 0.7–4.0)
Lymphs Abs: 2 10*3/uL (ref 0.7–4.0)
Lymphs Abs: 2.1 10*3/uL (ref 0.7–4.0)
Monocytes Absolute: 0.4 10*3/uL (ref 0.1–1.0)
Monocytes Absolute: 0.4 10*3/uL (ref 0.1–1.0)
Monocytes Absolute: 0.5 10*3/uL (ref 0.1–1.0)
Monocytes Relative: 6 % (ref 3–12)
Monocytes Relative: 6 % (ref 3–12)
Monocytes Relative: 6 % (ref 3–12)
Monocytes Relative: 6 % (ref 3–12)
Monocytes Relative: 6 % (ref 3–12)
Monocytes Relative: 7 % (ref 3–12)
Monocytes Relative: 4 % (ref 3–12)
Monocytes Relative: 5 % (ref 3–12)
Monocytes Relative: 6 % (ref 3–12)
Neutro Abs: 3.1 10*3/uL (ref 1.7–7.7)
Neutro Abs: 4.1 10*3/uL (ref 1.7–7.7)
Neutro Abs: 4.6 10*3/uL (ref 1.7–7.7)
Neutro Abs: 3.2 10*3/uL (ref 1.7–7.7)
Neutro Abs: 9.8 10*3/uL — ABNORMAL HIGH (ref 1.7–7.7)
Neutrophils Relative %: 52 % (ref 43–77)
Neutrophils Relative %: 56 % (ref 43–77)
Neutrophils Relative %: 58 % (ref 43–77)
Neutrophils Relative %: 60 % (ref 43–77)
Neutrophils Relative %: 61 % (ref 43–77)
Neutrophils Relative %: 62 % (ref 43–77)
Neutrophils Relative %: 71 % (ref 43–77)
Neutrophils Relative %: 56 % (ref 43–77)
Neutrophils Relative %: 64 % (ref 43–77)
Neutrophils Relative %: 69 % (ref 43–77)

## 2010-07-18 LAB — PROTIME-INR
INR: 1.9 — ABNORMAL HIGH (ref 0.00–1.49)
INR: 1.9 — ABNORMAL HIGH (ref 0.00–1.49)
INR: 2 — ABNORMAL HIGH (ref 0.00–1.49)
INR: 2.3 — ABNORMAL HIGH (ref 0.00–1.49)
INR: 2.4 — ABNORMAL HIGH (ref 0.00–1.49)
INR: 2.4 — ABNORMAL HIGH (ref 0.00–1.49)
INR: 1.8 — ABNORMAL HIGH (ref 0.00–1.49)
INR: 2.4 — ABNORMAL HIGH (ref 0.00–1.49)
INR: 2.3 — ABNORMAL HIGH (ref 0.00–1.49)
INR: 2.4 — ABNORMAL HIGH (ref 0.00–1.49)
INR: 2.4 — ABNORMAL HIGH (ref 0.00–1.49)
Prothrombin Time: 23.3 seconds — ABNORMAL HIGH (ref 11.6–15.2)
Prothrombin Time: 24.3 seconds — ABNORMAL HIGH (ref 11.6–15.2)
Prothrombin Time: 26.6 seconds — ABNORMAL HIGH (ref 11.6–15.2)
Prothrombin Time: 22 seconds — ABNORMAL HIGH (ref 11.6–15.2)
Prothrombin Time: 22.4 seconds — ABNORMAL HIGH (ref 11.6–15.2)
Prothrombin Time: 28.2 seconds — ABNORMAL HIGH (ref 11.6–15.2)
Prothrombin Time: 32.3 seconds — ABNORMAL HIGH (ref 11.6–15.2)
Prothrombin Time: 34 seconds — ABNORMAL HIGH (ref 11.6–15.2)
Prothrombin Time: 25.2 seconds — ABNORMAL HIGH (ref 11.6–15.2)
Prothrombin Time: 26.6 seconds — ABNORMAL HIGH (ref 11.6–15.2)
Prothrombin Time: 27.9 seconds — ABNORMAL HIGH (ref 11.6–15.2)
Prothrombin Time: 28.1 seconds — ABNORMAL HIGH (ref 11.6–15.2)
Prothrombin Time: 28.7 seconds — ABNORMAL HIGH (ref 11.6–15.2)

## 2010-07-18 LAB — BASIC METABOLIC PANEL
BUN: 6 mg/dL (ref 6–23)
BUN: 8 mg/dL (ref 6–23)
BUN: 8 mg/dL (ref 6–23)
BUN: 9 mg/dL (ref 6–23)
BUN: 9 mg/dL (ref 6–23)
BUN: 4 mg/dL — ABNORMAL LOW (ref 6–23)
BUN: 4 mg/dL — ABNORMAL LOW (ref 6–23)
BUN: 7 mg/dL (ref 6–23)
BUN: 9 mg/dL (ref 6–23)
CO2: 28 mEq/L (ref 19–32)
CO2: 22 mEq/L (ref 19–32)
CO2: 23 mEq/L (ref 19–32)
Calcium: 9.3 mg/dL (ref 8.4–10.5)
Calcium: 9.6 mg/dL (ref 8.4–10.5)
Calcium: 9.1 mg/dL (ref 8.4–10.5)
Chloride: 102 mEq/L (ref 96–112)
Chloride: 103 mEq/L (ref 96–112)
Chloride: 103 mEq/L (ref 96–112)
Chloride: 103 mEq/L (ref 96–112)
Chloride: 103 mEq/L (ref 96–112)
Chloride: 104 mEq/L (ref 96–112)
Chloride: 102 mEq/L (ref 96–112)
Chloride: 105 mEq/L (ref 96–112)
Chloride: 103 mEq/L (ref 96–112)
Chloride: 105 mEq/L (ref 96–112)
Creatinine, Ser: 0.67 mg/dL (ref 0.4–1.5)
Creatinine, Ser: 0.71 mg/dL (ref 0.4–1.5)
Creatinine, Ser: 0.76 mg/dL (ref 0.4–1.5)
Creatinine, Ser: 0.84 mg/dL (ref 0.4–1.5)
Creatinine, Ser: 0.8 mg/dL (ref 0.4–1.5)
Creatinine, Ser: 0.97 mg/dL (ref 0.4–1.5)
Creatinine, Ser: 1.21 mg/dL (ref 0.4–1.5)
Creatinine, Ser: 0.69 mg/dL (ref 0.4–1.5)
Creatinine, Ser: 0.71 mg/dL (ref 0.4–1.5)
Creatinine, Ser: 0.86 mg/dL (ref 0.4–1.5)
GFR calc Af Amer: >60 mL/min (ref >60)
GFR calc Af Amer: >60 mL/min (ref >60)
GFR calc Af Amer: >60 mL/min (ref >60)
GFR calc Af Amer: >60 mL/min (ref >60)
GFR calc Af Amer: >60 mL/min (ref >60)
GFR calc non Af Amer: >60 mL/min (ref >60)
GFR calc non Af Amer: >60 mL/min (ref >60)
GFR calc non Af Amer: >60 mL/min (ref >60)
GFR calc non Af Amer: >60 mL/min (ref >60)
GFR calc non Af Amer: >60 mL/min (ref >60)
GFR calc non Af Amer: >60 mL/min (ref >60)
GFR calc non Af Amer: >60 mL/min (ref >60)
GFR calc non Af Amer: >60 mL/min (ref >60)
Glucose, Bld: 100 mg/dL — ABNORMAL HIGH (ref 70–99)
Glucose, Bld: 109 mg/dL — ABNORMAL HIGH (ref 70–99)
Glucose, Bld: 151 mg/dL — ABNORMAL HIGH (ref 70–99)
Glucose, Bld: 169 mg/dL — ABNORMAL HIGH (ref 70–99)
Glucose, Bld: 103 mg/dL — ABNORMAL HIGH (ref 70–99)
Glucose, Bld: 117 mg/dL — ABNORMAL HIGH (ref 70–99)
Potassium: 3.5 mEq/L (ref 3.5–5.1)
Potassium: 3.7 mEq/L (ref 3.5–5.1)
Potassium: 3.7 mEq/L (ref 3.5–5.1)
Potassium: 3.8 mEq/L (ref 3.5–5.1)
Potassium: 3.6 mEq/L (ref 3.5–5.1)
Potassium: 4.1 mEq/L (ref 3.5–5.1)
Potassium: 3.5 mEq/L (ref 3.5–5.1)
Potassium: 3.7 mEq/L (ref 3.5–5.1)
Sodium: 136 mEq/L (ref 135–145)
Sodium: 137 mEq/L (ref 135–145)
Sodium: 139 mEq/L (ref 135–145)
Sodium: 139 mEq/L (ref 135–145)

## 2010-07-18 LAB — CBC
HCT: 31.7 % — ABNORMAL LOW (ref 39.0–52.0)
HCT: 32.7 % — ABNORMAL LOW (ref 39.0–52.0)
HCT: 32.8 % — ABNORMAL LOW (ref 39.0–52.0)
HCT: 33.4 % — ABNORMAL LOW (ref 39.0–52.0)
HCT: 33.8 % — ABNORMAL LOW (ref 39.0–52.0)
HCT: 33.7 % — ABNORMAL LOW (ref 39.0–52.0)
HCT: 33.9 % — ABNORMAL LOW (ref 39.0–52.0)
HCT: 38.4 % — ABNORMAL LOW (ref 39.0–52.0)
HCT: 39.9 % (ref 39.0–52.0)
HCT: 33.8 % — ABNORMAL LOW (ref 39.0–52.0)
HCT: 33.9 % — ABNORMAL LOW (ref 39.0–52.0)
Hemoglobin: 10.6 g/dL — ABNORMAL LOW (ref 13.0–17.0)
Hemoglobin: 10.7 g/dL — ABNORMAL LOW (ref 13.0–17.0)
Hemoglobin: 10.7 g/dL — ABNORMAL LOW (ref 13.0–17.0)
Hemoglobin: 12.7 g/dL — ABNORMAL LOW (ref 13.0–17.0)
Hemoglobin: 10.6 g/dL — ABNORMAL LOW (ref 13.0–17.0)
Hemoglobin: 10.8 g/dL — ABNORMAL LOW (ref 13.0–17.0)
MCHC: 31.1 g/dL (ref 30.0–36.0)
MCHC: 31.3 g/dL (ref 30.0–36.0)
MCHC: 31.6 g/dL (ref 30.0–36.0)
MCHC: 31.4 g/dL (ref 30.0–36.0)
MCHC: 31.5 g/dL (ref 30.0–36.0)
MCHC: 31.7 g/dL (ref 30.0–36.0)
MCHC: 31.3 g/dL (ref 30.0–36.0)
MCHC: 31.5 g/dL (ref 30.0–36.0)
MCV: 72.2 fL — ABNORMAL LOW (ref 78.0–100.0)
MCV: 73 fL — ABNORMAL LOW (ref 78.0–100.0)
MCV: 73.1 fL — ABNORMAL LOW (ref 78.0–100.0)
MCV: 73.4 fL — ABNORMAL LOW (ref 78.0–100.0)
MCV: 73.5 fL — ABNORMAL LOW (ref 78.0–100.0)
MCV: 70.9 fL — ABNORMAL LOW (ref 78.0–100.0)
MCV: 71.5 fL — ABNORMAL LOW (ref 78.0–100.0)
MCV: 71.7 fL — ABNORMAL LOW (ref 78.0–100.0)
MCV: 73.1 fL — ABNORMAL LOW (ref 78.0–100.0)
MCV: 73.6 fL — ABNORMAL LOW (ref 78.0–100.0)
MCV: 74.3 fL — ABNORMAL LOW (ref 78.0–100.0)
Platelets: 256 10*3/uL (ref 150–400)
Platelets: 257 10*3/uL (ref 150–400)
Platelets: 258 10*3/uL (ref 150–400)
Platelets: 274 10*3/uL (ref 150–400)
Platelets: 275 10*3/uL (ref 150–400)
Platelets: 280 10*3/uL (ref 150–400)
Platelets: 362 10*3/uL (ref 150–400)
Platelets: 227 10*3/uL (ref 150–400)
Platelets: 238 10*3/uL (ref 150–400)
Platelets: 242 10*3/uL (ref 150–400)
Platelets: 246 10*3/uL (ref 150–400)
RBC: 4.48 MIL/uL (ref 4.22–5.81)
RBC: 4.55 MIL/uL (ref 4.22–5.81)
RBC: 4.63 MIL/uL (ref 4.22–5.81)
RBC: 4.71 MIL/uL (ref 4.22–5.81)
RBC: 4.72 MIL/uL (ref 4.22–5.81)
RBC: 4.84 MIL/uL (ref 4.22–5.81)
RBC: 5.01 MIL/uL (ref 4.22–5.81)
RBC: 5.59 MIL/uL (ref 4.22–5.81)
RBC: 4.55 MIL/uL (ref 4.22–5.81)
RBC: 4.57 MIL/uL (ref 4.22–5.81)
RDW: 17.5 % — ABNORMAL HIGH (ref 11.5–15.5)
RDW: 17.6 % — ABNORMAL HIGH (ref 11.5–15.5)
RDW: 17.1 % — ABNORMAL HIGH (ref 11.5–15.5)
RDW: 17.4 % — ABNORMAL HIGH (ref 11.5–15.5)
RDW: 17.4 % — ABNORMAL HIGH (ref 11.5–15.5)
RDW: 17.6 % — ABNORMAL HIGH (ref 11.5–15.5)
RDW: 17.5 % — ABNORMAL HIGH (ref 11.5–15.5)
RDW: 18 % — ABNORMAL HIGH (ref 11.5–15.5)
RDW: 18.2 % — ABNORMAL HIGH (ref 11.5–15.5)
WBC: 5.2 10*3/uL (ref 4.0–10.5)
WBC: 5.5 10*3/uL (ref 4.0–10.5)
WBC: 5.6 10*3/uL (ref 4.0–10.5)
WBC: 6 10*3/uL (ref 4.0–10.5)
WBC: 6.6 10*3/uL (ref 4.0–10.5)
WBC: 6.9 10*3/uL (ref 4.0–10.5)
WBC: 7.1 10*3/uL (ref 4.0–10.5)
WBC: 7.3 10*3/uL (ref 4.0–10.5)
WBC: 10.3 10*3/uL (ref 4.0–10.5)
WBC: 9 10*3/uL (ref 4.0–10.5)
WBC: 9.8 10*3/uL (ref 4.0–10.5)
WBC: 10.4 10*3/uL (ref 4.0–10.5)
WBC: 5.5 10*3/uL (ref 4.0–10.5)
WBC: 5.7 10*3/uL (ref 4.0–10.5)
WBC: 7.8 10*3/uL (ref 4.0–10.5)

## 2010-07-18 LAB — CULTURE, BLOOD (ROUTINE X 2)

## 2010-07-18 LAB — URINE CULTURE: Colony Count: >=100000

## 2010-07-18 LAB — WOUND CULTURE

## 2010-07-18 LAB — URINE MICROSCOPIC-ADD ON

## 2010-07-18 LAB — CORTISOL
Cortisol, Plasma: 11.6 ug/dL
Cortisol, Plasma: 24.6 ug/dL
Cortisol, Plasma: 22.9 ug/dL
Cortisol, Plasma: 5.3 ug/dL

## 2010-07-18 LAB — LACTIC ACID, PLASMA: Lactic Acid, Venous: 2.9 mmol/L — ABNORMAL HIGH (ref 0.5–2.2)

## 2010-07-18 LAB — COMPREHENSIVE METABOLIC PANEL
ALT: 10 U/L (ref 0–53)
AST: 16 U/L (ref 0–37)
Albumin: 3.5 g/dL (ref 3.5–5.2)
Alkaline Phosphatase: 72 U/L (ref 39–117)
BUN: 5 mg/dL — ABNORMAL LOW (ref 6–23)
Calcium: 9.3 mg/dL (ref 8.4–10.5)
Creatinine, Ser: 0.6 mg/dL (ref 0.4–1.5)
GFR calc Af Amer: >60 mL/min (ref >60)
Glucose, Bld: 140 mg/dL — ABNORMAL HIGH (ref 70–99)
Potassium: 3.6 mEq/L (ref 3.5–5.1)
Sodium: 136 mEq/L (ref 135–145)
Total Protein: 7.9 g/dL (ref 6.0–8.3)
Total Protein: 7.7 g/dL (ref 6.0–8.3)

## 2010-07-18 LAB — URINALYSIS, ROUTINE W REFLEX MICROSCOPIC
Bilirubin Urine: NEGATIVE
Bilirubin Urine: NEGATIVE
Glucose, UA: NEGATIVE mg/dL
Ketones, ur: NEGATIVE mg/dL
Nitrite: NEGATIVE
Nitrite: POSITIVE — AB
Protein, ur: 100 mg/dL — AB
Protein, ur: 30 mg/dL — AB
Specific Gravity, Urine: 1.016 (ref 1.005–1.030)
Urobilinogen, UA: 0.2 mg/dL (ref 0.0–1.0)
pH: 7.5 (ref 5.0–8.0)

## 2010-07-18 LAB — LIPID PANEL
HDL: 30 mg/dL — ABNORMAL LOW (ref >39)
LDL Cholesterol: 76 mg/dL (ref 0–99)
LDL Cholesterol: 87 mg/dL (ref 0–99)
Total CHOL/HDL Ratio: 5.1 RATIO
Triglycerides: 103 mg/dL (ref <150)
VLDL: 21 mg/dL (ref 0–40)
VLDL: 25 mg/dL (ref 0–40)

## 2010-07-18 LAB — MAGNESIUM: Magnesium: 2 mg/dL (ref 1.5–2.5)

## 2010-07-18 LAB — TSH: TSH: 3.117 u[IU]/mL (ref 0.350–4.500)

## 2010-07-18 LAB — ACTH STIMULATION, 3 TIME POINTS
Cortisol, 30 Min: 15.4 ug/dL (ref >20)
Cortisol, 60 Min: 20.5 ug/dL (ref >20)

## 2010-07-18 LAB — POCT CARDIAC MARKERS

## 2010-07-18 LAB — BRAIN NATRIURETIC PEPTIDE: Pro B Natriuretic peptide (BNP): 38 pg/mL (ref 0.0–100.0)

## 2010-07-19 LAB — DIFFERENTIAL
Basophils Absolute: 0.1 10*3/uL (ref 0.0–0.1)
Basophils Relative: 1 % (ref 0–1)
Eosinophils Absolute: 0.2 10*3/uL (ref 0.0–0.7)
Lymphocytes Relative: 23 % (ref 12–46)
Monocytes Absolute: 0.5 10*3/uL (ref 0.1–1.0)
Neutro Abs: 5.4 10*3/uL (ref 1.7–7.7)
Neutrophils Relative %: 68 % (ref 43–77)

## 2010-07-19 LAB — PROTIME-INR
INR: 1.5 (ref 0.00–1.49)
INR: 2.2 — ABNORMAL HIGH (ref 0.00–1.49)
INR: 2.2 — ABNORMAL HIGH (ref 0.00–1.49)
INR: 2.2 — ABNORMAL HIGH (ref 0.00–1.49)
INR: 2.4 — ABNORMAL HIGH (ref 0.00–1.49)
Prothrombin Time: 25.4 seconds — ABNORMAL HIGH (ref 11.6–15.2)
Prothrombin Time: 26.1 seconds — ABNORMAL HIGH (ref 11.6–15.2)
Prothrombin Time: 28.1 seconds — ABNORMAL HIGH (ref 11.6–15.2)

## 2010-07-19 LAB — GLUCOSE, CAPILLARY: Glucose-Capillary: 120 mg/dL — ABNORMAL HIGH (ref 70–99)

## 2010-07-19 LAB — CROSSMATCH: ABO/RH(D): B POS

## 2010-07-19 LAB — BASIC METABOLIC PANEL
BUN: 3 mg/dL — ABNORMAL LOW (ref 6–23)
BUN: 4 mg/dL — ABNORMAL LOW (ref 6–23)
BUN: 5 mg/dL — ABNORMAL LOW (ref 6–23)
CO2: 23 mEq/L (ref 19–32)
CO2: 25 mEq/L (ref 19–32)
Calcium: 8.4 mg/dL (ref 8.4–10.5)
Calcium: 8.6 mg/dL (ref 8.4–10.5)
Chloride: 107 mEq/L (ref 96–112)
Creatinine, Ser: 0.77 mg/dL (ref 0.4–1.5)
Creatinine, Ser: 0.8 mg/dL (ref 0.4–1.5)
GFR calc Af Amer: >60 mL/min (ref >60)
GFR calc non Af Amer: >60 mL/min (ref >60)
Potassium: 3.9 mEq/L (ref 3.5–5.1)

## 2010-07-19 LAB — FERRITIN: Ferritin: 52 ng/mL (ref 22–322)

## 2010-07-19 LAB — CBC
HCT: 25.9 % — ABNORMAL LOW (ref 39.0–52.0)
HCT: 29 % — ABNORMAL LOW (ref 39.0–52.0)
Hemoglobin: 7.8 g/dL — CL (ref 13.0–17.0)
MCHC: 31.1 g/dL (ref 30.0–36.0)
MCHC: 31.5 g/dL (ref 30.0–36.0)
MCV: 77.4 fL — ABNORMAL LOW (ref 78.0–100.0)
MCV: 78.6 fL (ref 78.0–100.0)
Platelets: 330 10*3/uL (ref 150–400)
Platelets: 341 10*3/uL (ref 150–400)
Platelets: 342 10*3/uL (ref 150–400)
RBC: 3.34 MIL/uL — ABNORMAL LOW (ref 4.22–5.81)
RBC: 3.75 MIL/uL — ABNORMAL LOW (ref 4.22–5.81)
RDW: 20.5 % — ABNORMAL HIGH (ref 11.5–15.5)
WBC: 8 10*3/uL (ref 4.0–10.5)
WBC: 8.3 10*3/uL (ref 4.0–10.5)
WBC: 9.4 10*3/uL (ref 4.0–10.5)

## 2010-07-19 LAB — COMPREHENSIVE METABOLIC PANEL
Alkaline Phosphatase: 87 U/L (ref 39–117)
BUN: 6 mg/dL (ref 6–23)
CO2: 28 mEq/L (ref 19–32)
Chloride: 104 mEq/L (ref 96–112)
Creatinine, Ser: 0.82 mg/dL (ref 0.4–1.5)
GFR calc non Af Amer: >60 mL/min (ref >60)
Potassium: 3.3 mEq/L — ABNORMAL LOW (ref 3.5–5.1)
Total Bilirubin: 0.5 mg/dL (ref 0.3–1.2)

## 2010-07-19 LAB — IRON AND TIBC
Iron: 38 ug/dL — ABNORMAL LOW (ref 42–135)
UIBC: 206 ug/dL

## 2010-07-19 LAB — URINE MICROSCOPIC-ADD ON

## 2010-07-19 LAB — URINALYSIS, ROUTINE W REFLEX MICROSCOPIC
Nitrite: POSITIVE — AB
Specific Gravity, Urine: 1.025 (ref 1.005–1.030)
Urobilinogen, UA: 0.2 mg/dL (ref 0.0–1.0)

## 2010-07-19 LAB — HEMOGLOBIN A1C: Mean Plasma Glucose: 108 mg/dL

## 2010-07-19 LAB — VITAMIN B12: Vitamin B-12: 1401 pg/mL — ABNORMAL HIGH (ref 211–911)

## 2010-07-19 LAB — CARDIAC PANEL(CRET KIN+CKTOT+MB+TROPI)
CK, MB: 1.7 ng/mL (ref 0.3–4.0)
Total CK: 88 U/L (ref 7–232)
Troponin I: 0.03 ng/mL (ref 0.00–0.06)

## 2010-07-19 LAB — CULTURE, BLOOD (ROUTINE X 2): Culture: NO GROWTH

## 2010-07-19 LAB — URINE CULTURE

## 2010-07-19 LAB — TROPONIN I: Troponin I: 0.01 ng/mL (ref 0.00–0.06)

## 2010-07-19 LAB — ABO/RH: ABO/RH(D): B POS

## 2010-07-22 ENCOUNTER — Emergency Department: Payer: Self-pay | Admitting: Emergency Medicine

## 2010-07-23 LAB — PROTIME-INR
INR: 1.2 (ref 0.00–1.49)
INR: 1.2 (ref 0.00–1.49)
INR: 1.5 (ref 0.00–1.49)
INR: 2 — ABNORMAL HIGH (ref 0.00–1.49)
INR: 2.4 — ABNORMAL HIGH (ref 0.00–1.49)
INR: 2.7 — ABNORMAL HIGH (ref 0.00–1.49)
INR: 3.2 — ABNORMAL HIGH (ref 0.00–1.49)
Prothrombin Time: 15.2 seconds (ref 11.6–15.2)
Prothrombin Time: 15.5 seconds — ABNORMAL HIGH (ref 11.6–15.2)
Prothrombin Time: 18.5 seconds — ABNORMAL HIGH (ref 11.6–15.2)
Prothrombin Time: 27.4 seconds — ABNORMAL HIGH (ref 11.6–15.2)
Prothrombin Time: 31 seconds — ABNORMAL HIGH (ref 11.6–15.2)
Prothrombin Time: 37.7 seconds — ABNORMAL HIGH (ref 11.6–15.2)

## 2010-07-23 LAB — GLUCOSE, CAPILLARY
Glucose-Capillary: 102 mg/dL — ABNORMAL HIGH (ref 70–99)
Glucose-Capillary: 102 mg/dL — ABNORMAL HIGH (ref 70–99)
Glucose-Capillary: 103 mg/dL — ABNORMAL HIGH (ref 70–99)
Glucose-Capillary: 118 mg/dL — ABNORMAL HIGH (ref 70–99)
Glucose-Capillary: 92 mg/dL (ref 70–99)
Glucose-Capillary: 93 mg/dL (ref 70–99)
Glucose-Capillary: 98 mg/dL (ref 70–99)

## 2010-07-23 LAB — DIFFERENTIAL
Basophils Absolute: 0 10*3/uL (ref 0.0–0.1)
Basophils Relative: 0 % (ref 0–1)
Eosinophils Relative: 3 % (ref 0–5)
Lymphocytes Relative: 26 % (ref 12–46)
Monocytes Absolute: 0.5 10*3/uL (ref 0.1–1.0)
Neutro Abs: 5 10*3/uL (ref 1.7–7.7)

## 2010-07-23 LAB — URINALYSIS, ROUTINE W REFLEX MICROSCOPIC
Bilirubin Urine: NEGATIVE
Glucose, UA: NEGATIVE mg/dL
Specific Gravity, Urine: 1.011 (ref 1.005–1.030)

## 2010-07-23 LAB — CBC
HCT: 32.6 % — ABNORMAL LOW (ref 39.0–52.0)
HCT: 34.3 % — ABNORMAL LOW (ref 39.0–52.0)
HCT: 39 % (ref 39.0–52.0)
Hemoglobin: 10.3 g/dL — ABNORMAL LOW (ref 13.0–17.0)
Hemoglobin: 10.5 g/dL — ABNORMAL LOW (ref 13.0–17.0)
Hemoglobin: 10.7 g/dL — ABNORMAL LOW (ref 13.0–17.0)
Hemoglobin: 11.4 g/dL — ABNORMAL LOW (ref 13.0–17.0)
Hemoglobin: 12.1 g/dL — ABNORMAL LOW (ref 13.0–17.0)
MCHC: 31.4 g/dL (ref 30.0–36.0)
MCV: 71.8 fL — ABNORMAL LOW (ref 78.0–100.0)
MCV: 71.9 fL — ABNORMAL LOW (ref 78.0–100.0)
MCV: 72 fL — ABNORMAL LOW (ref 78.0–100.0)
MCV: 72.2 fL — ABNORMAL LOW (ref 78.0–100.0)
RBC: 4.56 MIL/uL (ref 4.22–5.81)
RBC: 4.72 MIL/uL (ref 4.22–5.81)
RBC: 5.02 MIL/uL (ref 4.22–5.81)
RBC: 5.43 MIL/uL (ref 4.22–5.81)
RDW: 16.3 % — ABNORMAL HIGH (ref 11.5–15.5)
RDW: 17 % — ABNORMAL HIGH (ref 11.5–15.5)
WBC: 4.9 10*3/uL (ref 4.0–10.5)
WBC: 5.2 10*3/uL (ref 4.0–10.5)
WBC: 7.8 10*3/uL (ref 4.0–10.5)

## 2010-07-23 LAB — RAPID URINE DRUG SCREEN, HOSP PERFORMED
Barbiturates: NOT DETECTED
Benzodiazepines: NOT DETECTED
Cocaine: NOT DETECTED
Opiates: POSITIVE — AB

## 2010-07-23 LAB — URINE CULTURE

## 2010-07-23 LAB — BASIC METABOLIC PANEL
CO2: 25 mEq/L (ref 19–32)
Calcium: 8.7 mg/dL (ref 8.4–10.5)
Calcium: 8.9 mg/dL (ref 8.4–10.5)
Chloride: 104 mEq/L (ref 96–112)
Chloride: 105 mEq/L (ref 96–112)
Chloride: 105 mEq/L (ref 96–112)
Creatinine, Ser: 0.76 mg/dL (ref 0.4–1.5)
GFR calc Af Amer: >60 mL/min (ref >60)
GFR calc Af Amer: >60 mL/min (ref >60)
GFR calc Af Amer: >60 mL/min (ref >60)
GFR calc non Af Amer: >60 mL/min (ref >60)
GFR calc non Af Amer: >60 mL/min (ref >60)
Glucose, Bld: 108 mg/dL — ABNORMAL HIGH (ref 70–99)
Glucose, Bld: 99 mg/dL (ref 70–99)
Potassium: 3.6 mEq/L (ref 3.5–5.1)
Potassium: 3.6 mEq/L (ref 3.5–5.1)
Potassium: 3.6 mEq/L (ref 3.5–5.1)
Sodium: 136 mEq/L (ref 135–145)
Sodium: 136 mEq/L (ref 135–145)
Sodium: 137 mEq/L (ref 135–145)
Sodium: 139 mEq/L (ref 135–145)

## 2010-07-23 LAB — COMPREHENSIVE METABOLIC PANEL
Albumin: 3.5 g/dL (ref 3.5–5.2)
Alkaline Phosphatase: 76 U/L (ref 39–117)
BUN: 7 mg/dL (ref 6–23)
CO2: 28 mEq/L (ref 19–32)
Chloride: 100 mEq/L (ref 96–112)
Creatinine, Ser: 0.73 mg/dL (ref 0.4–1.5)
GFR calc non Af Amer: >60 mL/min (ref >60)
Potassium: 3.5 mEq/L (ref 3.5–5.1)
Total Bilirubin: 0.5 mg/dL (ref 0.3–1.2)

## 2010-07-23 LAB — WOUND CULTURE

## 2010-07-23 LAB — URINE MICROSCOPIC-ADD ON

## 2010-07-23 LAB — LIPID PANEL
HDL: 30 mg/dL — ABNORMAL LOW (ref >39)
LDL Cholesterol: 78 mg/dL (ref 0–99)
Triglycerides: 194 mg/dL — ABNORMAL HIGH (ref <150)
VLDL: 39 mg/dL (ref 0–40)

## 2010-07-24 LAB — CBC
HCT: 34 % — ABNORMAL LOW (ref 39.0–52.0)
Hemoglobin: 10.7 g/dL — ABNORMAL LOW (ref 13.0–17.0)
MCHC: 31.5 g/dL (ref 30.0–36.0)
MCV: 71.8 fL — ABNORMAL LOW (ref 78.0–100.0)
RDW: 17 % — ABNORMAL HIGH (ref 11.5–15.5)

## 2010-07-24 LAB — BASIC METABOLIC PANEL
CO2: 25 mEq/L (ref 19–32)
Chloride: 103 mEq/L (ref 96–112)
Glucose, Bld: 102 mg/dL — ABNORMAL HIGH (ref 70–99)
Potassium: 3.8 mEq/L (ref 3.5–5.1)
Sodium: 139 mEq/L (ref 135–145)

## 2010-07-24 LAB — DIFFERENTIAL
Basophils Absolute: 0.1 10*3/uL (ref 0.0–0.1)
Basophils Relative: 1 % (ref 0–1)
Eosinophils Absolute: 0.3 10*3/uL (ref 0.0–0.7)
Lymphs Abs: 2.4 10*3/uL (ref 0.7–4.0)
Neutro Abs: 5.8 10*3/uL (ref 1.7–7.7)

## 2010-07-24 LAB — URINALYSIS, ROUTINE W REFLEX MICROSCOPIC
Bilirubin Urine: NEGATIVE
Glucose, UA: NEGATIVE mg/dL
Glucose, UA: NEGATIVE mg/dL
Ketones, ur: NEGATIVE mg/dL
Protein, ur: NEGATIVE mg/dL
Urobilinogen, UA: 0.2 mg/dL (ref 0.0–1.0)
pH: 6.5 (ref 5.0–8.0)

## 2010-07-24 LAB — URINE MICROSCOPIC-ADD ON

## 2010-07-24 LAB — URINE CULTURE: Colony Count: >=100000

## 2010-07-26 ENCOUNTER — Ambulatory Visit: Payer: Self-pay | Admitting: Oncology

## 2010-08-02 ENCOUNTER — Encounter: Payer: Self-pay | Admitting: Cardiothoracic Surgery

## 2010-08-02 ENCOUNTER — Encounter: Payer: Self-pay | Admitting: Nurse Practitioner

## 2010-08-07 ENCOUNTER — Ambulatory Visit: Payer: Self-pay | Admitting: Oncology

## 2010-08-07 ENCOUNTER — Encounter: Payer: Self-pay | Admitting: Nurse Practitioner

## 2010-08-07 ENCOUNTER — Encounter: Payer: Self-pay | Admitting: Cardiothoracic Surgery

## 2010-08-21 ENCOUNTER — Inpatient Hospital Stay: Payer: Self-pay | Admitting: Internal Medicine
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
## 2010-08-21 NOTE — Discharge Summary (Signed)
NAME:  [name] [name], [name] [name] NO.:  [id number]   MEDICAL RECORD NO.:  [id number]          PATIENT TYPE:  INP   LOCATION:  NA                           FACILITY:  [facility]   PHYSICIAN:  Lonia Blood, M.D.      DATE OF BIRTH:  05/08/1960   DATE OF ADMISSION:  07/17/2008  DATE OF DISCHARGE:  07/29/2008                               DISCHARGE SUMMARY   PRIMARY CARE PHYSICIAN:  Dr. Baltazar Najjar, [facility].   DISCHARGE DIAGNOSES:  1. Sepsis secondary to urinary tract infection with Proteus mirabilis.  2. Hypotension and later hypertension.  3. Quadriplegia.  4. 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  5. Neurogenic bladder.  6. Multiple decubitus ulcers.  7. Anemia of chronic disease.   DISCHARGE MEDICATIONS:  1. Coumadin for a prior history of DVT, currently at 5 mg daily, dose      to be adjusted in the nursing home.  2. Clonidine 0.1 mg p.o. t.i.d.  3. Zinc sulfate 220 mg once a day.  4. Zanaflex 4 mg q.8 h.  5. Vitamin C 500 mg four times a day.  6. Vicodin 5/500 q.6 h p.r.n.  7. Flexeril 10 mg four times a day.  8. Ferrous sulfate 325 mg four times a day.  9. Neurontin 300 mg three times a day.  10.Reglan 10 mg three times a day.  11.Protonix 40 mg daily.  12.Paxil 20 mg daily.  13.Zocor 40 mg q.h.s.  14.Dulcolax suppository one suppository twice a day as needed.  15.Lorazepam 1 mg tablets q.8 h p.r.n.   DISPOSITION:  The patient will be returned back to skilled facility.  He  will have to resume his care at the nursing home.  He is stable.  He is  status post care that showed no blockage.   PROCEDURES PERFORMED:  1. Chest x-ray on April 15 shows stable, no new acute findings.  2. Cardiac catheterization performed on April 22, that showed no      ischemic lesions, no blockages.   CONSULTATIONS:  [facility], Dr. Tenny Craw.   BRIEF HISTORY AND PHYSICAL:  Please refer to dictated history and  physical by  Dr. Della Goo.  In short, however, the patient is a  50 year old quadriplegic male who was brought in from [facility]  with confusion and decreased blood pressure.  The patient was evaluated  in the ED and found to be hypotensive with blood pressure of 81/50 as  well as evidence of UTI and increase cells.  He was also anemic among  other things.  The patient was subsequently admitted for further  management.   HOSPITAL COURSE:  1. Hypotension and sepsis.  The patient was treated appropriately with      antibiotics as well as IV fluids.  He was in the ICU for awhile      with persistent hypotension.  During the period, he had a 2-D echo      done that showed his EF of only 25-30% as well as regional wall  motion abnormalities.  The patient responded to treatment.  He is      still on antibiotics, transitioned to oral Ceftin.  2. Cardiomyopathy.  As indicated, part of the patient's workup      included 2-D echo that showed an EF of only 25-30% with some      regional wall motion abnormalities.  For this reason, the patient      underwent a cardiac cath on April 22 that showed no specific      blockage.  With this finding, therefore, the patient did not      require any intervention.  3. Hypertension.  His blood pressure after being hypotensive has since      rebounded and it has been high at some point, so we have restarted      his home medicine which seemed to be helping with his blood      pressure control at this point.  4. UTI.  The patient had Proteus mirabilis draw out of his urine which      has been appropriately treated.  He will continue on Ceftin for      total of 15 days treatment.  5. Decubitus ulcers.  The patient has had wound care and antibiotics      and this should help his wound care.  6. Coagulopathy.  The patient has had history of DVT and has been on      Coumadin which was held for his care.  We will resume his Coumadin      now and he will  continue the Coumadin in the nursing facility.  7. Otherwise, the rest of his medical problems are chronic including      his quadriplegia and anemia.  So will resume his home care as per      before admission.      Lonia Blood, M.D.  Electronically Signed     LG/MEDQ  D:  07/29/2008  T:  07/29/2008  Job:  [id number]

## 2010-08-21 NOTE — Assessment & Plan Note (Signed)
[facility]   NAME:  [name] [name], [name] [name] NO.:  [id number]   MEDICAL RECORD NO.:  [id number]      DATE OF BIRTH:  15-Apr-1960   PHYSICIAN:  Jake Shark A. Tanda Rockers, M.D. 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  Most recently, we have followed him for an ulcerative bursitis involving  his left elbow.  He is accompanied by an attendant nurse who reports  that there have been new breakdowns on both feet and they are requesting  evaluations of these wounds also.  There has been no interim fever.   OBJECTIVE:  Blood pressure is 63/42, respirations 16, pulse rate 94,  temperature 97.6.  Capillary blood glucose is 110 mg percent.  The patient is alert, oriented, and in good contact with reality, and is  in no acute distress.  Inspection of the left elbow shows that there is a 100% granulated wound  which is clean with no excessive hyperemia, malodorous drainage, or  exudate.  There is evidence of contraction as well as advancing  epithelium.  Inspection of the lower extremities is remarkable for  bilateral 3+ edema.  There are multiple wounds which were numbered 8  through 12 inclusively.  Each wound was measured, photographed, and  entered into the database.  Please refer directly to those entries.  Wound #8 on the right fifth metatarsal portion of the foot is a stage IV  ulcer extending through the subcutaneous tissue and juxtaposing the  joint capsule and shaft of the metatarsal.  An excisional debridement  was performed of this wound with excision of necrotic skin, subcutaneous  tissue, and fascia.  Wound #9 on the inferior aspect of the fifth  metatarsal of the right foot is an un-stagable stasis ulcer.  Wound #10  on the left toe is an abrasion, which extends into the subcutaneous  tissue.  No debridement was needed.  Wound #12 on the left plantar heel  is a granulating wound with no drainage or evidence of infection. Wound  #11 is a deep plantar ulcer that extends into the cortex of the  calcaneus.  This wound underwent a full-thickness excisional debridement  using the 10 blade with excision of necrotic skin, cornified epithelium,  subcutaneous tissue, fascia, and necrotic tissue.  The debrided wounds  were dressed with Aquacel silver, topical tri-antibiotic ointment was  placed on wound number 10.  Both feet were dressed with bulky  nonadherent dressings.  His pedal pulses are palpable.  He is  quadraplegic.   ASSESSMENT:  Clinical improvement of the ulcerative bursitis of the left  elbow with multiple pressure ulcers involving both feet.   PLAN:  We will continue local care utilizing antiseptic soap, silver  absorptive dressings.  We have requested that physical therapy continue  to irrigate these wounds and continue with the use of diathermy and the  treatment protocols with serial debridements as needed.  In addition, we  have strongly recommended that this patient be  evaluated for custom protective foot wear, as I feel that these wounds  are undoubtedly related to recurrent trauma.  We have explained this  recommendation to the patient as well as his nurse attendant.  We will  continue to see the patient on a monthly basis p.r.n.      [name], M.D.  Electronically Signed     HAN/MEDQ  D:  06/11/2007  T:  06/11/2007  Job:  [id number]   cc:   Candyce Churn, M.D.
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
## 2010-08-21 NOTE — Discharge Summary (Signed)
[name], [name]              ACCOUNT NO.:  [id number]   MEDICAL RECORD NO.:  [id number]          PATIENT TYPE:  INP   LOCATION:  3712                         FACILITY:  [facility]   PHYSICIAN:  [name] I Elsaid, MD      DATE OF BIRTH:  Jul 27, 1960   DATE OF ADMISSION:  07/31/2008  DATE OF DISCHARGE:  08/05/2008                               DISCHARGE SUMMARY   DISCHARGE DIAGNOSES:  1. Dizziness felt to be secondary to hyotension.  2. 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  3. Quadriplegia.  4. Cardiomyopathy, nonischemic with ejection fraction of 30%.  5. Neurogenic bladder with leaking Foley catheter so this was changed.  6. Multiple decubitus ulcers.   DISCHARGE MEDICATIONS:  1. Zocor 40 mg p.o. at bedtime.  2. Protonix 40 mg daily.  3. Clonidine 0.1 mg 3 times daily.  4. Zinc sulfate 220 mg once daily.  5. Vitamin C 500 mg 4 times daily.  6. Zanaflex 4 mg 3 times daily.  7. Vicodin 5/500 mg q.4 hours as needed.  8. Flexeril 10 mg 4 times daily.  9. Ferrous sulfate 325 mg 3 times daily.  10.Neurontin 300 mg 3 times daily.  11.ativan 1 mg as needed.  12.Coumadin 2 mg p.o. daily.  PT/INR to be checked at nursing home      daily.  Goal is INR 2 to 3 until they reach tto fixed dose.  13.Prednisone 40 mg p.o. for 2 days and then prednisone 20 mg p.o. for      2 days and then prednisone 10 mg p.o. for 2 days and then      prednisone 5 mg for 2 days and then stop.  14.Lisinopril 5 mg p.o. daily.  15.Paxil 20 mg p.o. daily.  16.Fluconazole 100 mg p.o. daily for 2 days.  17.Ceftin 250 mg p.o. b.i.d. for 5 days.  18.Dulcolax suppositories p.r.n.  19.Zanaflex 4 mg p.o. q.8 hours.  20.Vitamin C 500 mg p.o. q.i.d.  21.Reglan 10 mg p.o. t.i.d.   CONSULTATIONS:  Urology consulted for a leaking Foley catheter.   PROCEDURES:  1. Chest x-ray:  Mild cardiomegaly.  No acute event.  2. CT head:  No acute intracranial abnormalities.  3. Had a recent 2-D echo on July 22, 2008 which showed ejection      fraction 25% to 30%.  No pericardial effusion.   HISTORY OF PRESENT ILLNESS:  64. A 50 year old male with a history of C4-C6 spinal cord injury,      remained quadriplegic after trauma, history of nonischemic      cardiomyopathy admitted for dizziness.  In the ER, the patient was      found to be hypotensive.  The patient admitted to step-down unit      for close observation, possibility of sepsis secondary to urinary      tract infection.  The patient had repeat septic workup with 1 set      of blood cultures growing Staphylococcus coagulase-negative, the      other bottle was negative, which was  felt secondary to      contamination.  For that, Zosyn and vancomycin was discontinued.      Urine culture grew 80,000 yeast infection and the patient was      placed empirically on Diflucan.  Dose will be completed within 2      days.  As the patient has a history of urinary tract infection      secondary to Proteus mirabilis, the patient will be discharged with      Ceftin for an extra 5 days.  During hospital stay, the patient had      no fever, no white blood cells.  Blood pressure kept fluctuating      and diagnosis of autonomic dysreflexia was made secondary to spinal      cord injury.  We felt the dizziness most probably secondary to the      fluctuation of blood pressure secondary to spinal cord injury.  The      patient will be discharged with the same blood pressure medicine as      dictated above.  The patient was eventually transferred to      telemetry, had no further episodes of hypotension.  The patient had      random cortisol level which was 122.9 and TSH of 0.87.  2. Leaking Foley catheter.  Urology consulted and they recommended      suprapubic catheter as outpatient.  The patient needs to follow up      with [facility] for possibility of placement of suprapubic      catheter.  We changed the Foley  catheter during the hospital stay.  3. Decubitus ulcer.  Wound care did evaluate the patient.  Has chronic      stage IV wounds in several sites on the right outer foot and also      on the sacrum and the buttock.  Recommendation was to apply Aquacel      to absorb drainage and provide antimicrobial benefit.  4. 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  5. History of DVT.  The patient's INR is elevated secondary to the      Diflucan.  The patient will be discharged with Coumadin 2 mg p.o.      and PT and INR to be checked at the nursing facility until reaching      the appropriate dose after Diflucan is stopped.  The patient will      be discharged with 2 days of Diflucan and Coumadin 2 mg.  We      recommend the nursing home to check PT/INR daily until reaching to      the accurate dose.   At this time, I felt the patient was medically stable to be transferred  back to nursing home.      [name] Bosie Helper, MD  Electronically Signed     HIE/MEDQ  D:  08/05/2008  T:  08/05/2008  Job:  [id number]
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
## 2010-08-21 NOTE — Assessment & Plan Note (Signed)
[facility]   NAME:  [name] [name], [name] [name] NO.:  [id number]   MEDICAL RECORD NO.:  [id number]      DATE OF BIRTH:  Nov 23, 1960   PHYSICIAN:  Jake Shark A. Tanda Rockers, M.D. VISIT DATE:  05/14/2007                                   OFFICE VISIT   SUBJECTIVE:  [name] [name] returns for follow-up of a ulcerative bursitis  involving the left elbow.  He is accompanied by his wound care nurse  from his facility.  In the interim he has been managed with daily  antiseptic soap wash and Dakin's irrigation and packing.  There has been  no excessive drainage.  He continues to have passive range of motion in  the elbow.  There has been no interim fever.   OBJECTIVE:  Blood pressure is 76/48, respirations 18, pulse rate 89,  temperature 97.8, capillary blood glucose is 126 mg percent.  Inspection of the left elbow shows a 100% granulated ulcer overlying the  elbow.  There is significant 1 cm undermining between 4 and 8 o'clock.  The granulation tissue appears to be healthy.  Sounding with a Q-tip  fails to demonstrate any communication with the joint or articular  surfaces.   ASSESSMENT:  Clinical improvement of the infected bursitis on the with  the current treatment plan.   PLAN:  We will continue daily antiseptic soap wash and irrigations with  Dakin's solution.  We will reevaluate the patient in 2 weeks p.r.n.      [name], M.D.  Electronically Signed     HAN/MEDQ  D:  05/14/2007  T:  05/15/2007  Job:  [id number]
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
## 2010-08-21 NOTE — Consult Note (Signed)
NAME:  [name] [name], [name] [name] NO.:  [id number]   MEDICAL RECORD NO.:  [id number]          PATIENT TYPE:  INP   LOCATION:  1403                         FACILITY:  [facility]   PHYSICIAN:  Excell Seltzer. Annabell Howells, M.D.    DATE OF BIRTH:  [birth year]   DATE OF CONSULTATION:  03/28/2008  DATE OF DISCHARGE:                                 CONSULTATION   Patient of Dr. Johnella Moloney.  Briefly, [name] [name] is a 50 year old African  American male who is quadriplegic following a spinal cord injury.  He  has a neurogenic bladder with recurrent UTIs and is managed with chronic  Foley catheterization.  He was admitted on March 23, 2008, after his  catheter was removed traumatically at the nursing home.  He had  significant hematuria.  Dr. Kevan Ny has placed a consult for consideration  of a suprapubic tube.  The patient has been seen by Dr. Retta Diones in  June and actually had a Foley placed by Dr. Laverle Patter earlier in June.  He  had urodynamics in August that showed a small capacity hyperflexive  bladder with a low detrusor leak point pressure.  He had autonomic  dysreflexia during the study.  He also has acquired hypospadias.  Dr.  Retta Diones had mentioned suprapubic tube placement and cystoscopy but the  patient failed followup appointment after urodynamics.  His urine is now  clear and his course is complicated by a right DVT.   I have reviewed his old office records and current hospital chart.   EXAM:  He is a 50 year old quadriplegic.  ABDOMEN:  Protuberant and firm.  He has an uncircumcised phallus with a Foley indwelling.  There is  acquired hypospadias that is mild.  Scrotum is unremarkable.  Testicles  are bilaterally descended without mass.   IMPRESSION:  1. Neurogenic bladder with recurrent urinary tract infections, managed      with chronic Foley drainage.  2. Recent traumatic Foley removal with hematuria, now resolved.  3. 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  4. History  of autonomic dysreflexia.  5. Acute right lower extremity deep venous thrombosis.   RECOMMENDATIONS:  He is definitely a candidate for a suprapubic tube and  I have reviewed this with the patient including the risks of the  procedure.  However, he is no longer having hematuria and does have an  acute right DVT and it is most appropriate in my opinion that we  consider postponing placement of the suprapubic tube until he is  recovered from the deep venous thrombosis.  The bleeding has stopped and  he should be at low risk for bleeding with anticoagulation at this  point.  I will discuss this further with Dr. Kevan Ny.      Excell Seltzer. Annabell Howells, M.D.  Electronically Signed     JJW/MEDQ  D:  03/28/2008  T:  03/29/2008  Job:  [id number]

## 2010-08-21 NOTE — Assessment & Plan Note (Signed)
[facility]   NAME:  [name] [name], [name] [name] NO.:  [id number]   MEDICAL RECORD NO.:  [id number]          DATE OF BIRTH:   PHYSICIAN:  Theresia Majors. Tanda Rockers, M.D. 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  In the interim, he has been managed by the physical therapy  department as well as the wound care nurse at [facility].  There has  been no interim fever.  There is no pain.  There has been mild malodor  which has been well controlled with a daily cleansing.  There has been  no fever.   OBJECTIVE:  The patient is accompanied by an attendant. His blood  pressure 65/49, respirations 104, temperature 97, capillary blood  glucose 84 mg percent.  Inspection of wound #7 of the elbow shows a  clean granulating left elbow.  The range of motion is passively normal.  Wound #8 and #9 on the volar aspect of the right foot extend down to the  fascia with necrosis. Both wounds underwent excisional debridement  utilizing a #10 blade and rongeur. Thereafter they will dressed with  saline moist gauze. The debrided material included necrosed skin,  subcutaneous tissue, and tendon as well as fascia.  Wound #10 of the  left great toe is superficial and is clean with healthy granulation.  Wound #11 and #12 on the plantar surface of the left foot:  Wound #12 is  healthy and granulating without evidence of penetration beyond the  subcutaneous level.  There is 100% granulation. Wound #11 on the plantar  surface of the heel has exposed tendon and necrosis. An excisional  debridement of necrosed skin, subcutaneous tissue and the tendon was  performed with a rongeur. This wound was irrigated, and a moist-moist  dressing applied.   ASSESSMENT:  Multiple neuropathic ulcers, as described above,  along with  an ulcerative bursitis involving the left elbow.   PLAN:  We we have placed moist-moist dressings.  We have encouraged the  wound care service at [facility] to continue daily antiseptic soap  washing and irrigations with Dakin's solution.  We will  continue  offloading measures to include padding footwear to avoid undue pressure.  We will reevaluate the patient in 2 weeks.      [name], M.D.  Electronically Signed     HAN/MEDQ  D:  06/22/2007  T:  06/22/2007  Job:  [id number]
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
## 2010-08-21 NOTE — Assessment & Plan Note (Signed)
[facility]   NAME:  FRANKE, MENTER NO.:  [id number]   MEDICAL RECORD NO.:  [id number]      DATE OF BIRTH:  [birth year]   PHYSICIAN:  Jake Shark A. Tanda Rockers, M.D. VISIT DATE:  08/25/2007                                   OFFICE VISIT   SUBJECTIVE:  Mr. 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  During  the interim, he has been treated with moist-moist dressings of the  elbow.  He has also been treated with a Dakin's irrigation of the wounds  of the feet, and over the last week, the Dakin's has been discontinued  and he has started on regular saline rinse following antibacterial soap  cleansing.  He has also had a portable external topical oxygen unit  applied to the wound on the proximal lateral right foot.  This appliance  has been in use continuously over the past week.  There has been no  excessive drainage.  There has been no malodor.  He continues to have  the wound managed at [facility] by the wound care staff.  His diet  remains good.   OBJECTIVE:  Blood pressure is 200/119, respirations 18, pulse is 70 and  regular, temperature is 97.7, capillary blood glucose is 119 mg percent.  Inspection of the wound #7 of the left elbow shows that this wound is  contracted markedly.  There is now 100% granulation and advance  epithelium.  Wound #8 on the right superior fifth met head has also  contracted and has additional granulation and advancing epithelium.  Wound 9 has also improved along with 11 and wound 13 on the right ankle  has resolved.  All wounds were measured, photographed, and cataloged.  Please refer to the data input entries.   ASSESSMENT:  Clinical improvement with local care and moist-moist  technique.   PLAN:  We will continue offloading with the peripheral boots and  continue the  daily antibacterial washes with saline irrigations and  moist-moist dressings.  We will re-evaluate the patient in one month  p.r.n.      [name], M.D.  Electronically Signed     HAN/MEDQ  D:  08/25/2007  T:  08/26/2007  Job:  [id number]

## 2010-08-21 NOTE — Assessment & Plan Note (Signed)
[facility]   NAME:  CASPAR, FAVILA NO.:  [id number]   MEDICAL RECORD NO.:  [id number]      DATE OF BIRTH:  May 23, 1960   PHYSICIAN:  Jake Shark A. Tanda Rockers, M.D. VISIT DATE:  07/23/2007                                   OFFICE VISIT   SUBJECTIVE:  Mr. Kilburg is a 50 year old man who we followed for  ulcerative bursitis involving his left elbow.  He also has multiple  stage III pressure ulcers involving his lower extremities, attended with  his paraplegia.  His wounds had been managed in the interim with  antiseptic soap washes and Dakin's irrigation.  They have been debrided  by the physical therapy department at his residence.  There has been no  interim fever.  The patient feels that there is less malodor and less  drainage.   OBJECTIVE:  Blood pressure is 122/86, respirations 16, pulse rate 112,  and temperature 97.7.  Capillary blood glucose is 110 mg percent.  The  patient is unattended.  Inspection of the left elbow shows that there is  continued contraction of epithelium around a 100% granulated base.  There is no exudate and no malodor.  Wound #8 on the right superior mid  head similarly shows clean granulating tissue.  There is no extension  into the bone.  Wound #9 on the right lateral foot is the larger wound  measuring approximately 5 cm in greatest diameter.  There is 100%  granulation.  There is minimum tunneling at 9 o'clock.  There is no  evidence of extension into the bone; however, there is complete  granulation.  No exposed tendon or fibrous capsule.  Wound #11 on the  left plantar heel continues to contract with a granulating base and  advancing epithelium from the periphery.  Wound #12 previously on the  left heel is completely resolved.  There is a new wound #13 at the right  ankle and has a partial-thickness abrasion.  There is minimum erythema,  and no evidence of a ascending infection.  Both feet are warm with no  evidence of ischemia.  The neurological status is unchanged.  All wounds  were measured, photographed, and cataloged.  Please refer to the data  entries.   ASSESSMENT:  Overall improvement of all wounds.   PLAN:  We will continue wound care to wound #7, utilize an antibacterial  soap wash, and a daily dry dressing.  We will continue the antiseptic  soap wash of the wounds of the feet with Dakin's irrigation for an  additional week, then we will discontinue the Dakin's solution and use  moist-moist dressings.  We will reevaluate the patient in 2 weeks.  In  the interim, he will continue to protect the neuropathic feet with PRAFO  boots.  We have explained this approach to the patient in terms that he  seems to understand, and we had provided the residents with these  instructions.      [name], M.D.  Electronically Signed     HAN/MEDQ  D:  07/23/2007  T:  07/24/2007  Job:  [id number]

## 2010-08-21 NOTE — Assessment & Plan Note (Signed)
[facility]   NAME:  TAILOR, WESTFALL NO.:  [id number]   MEDICAL RECORD NO.:  [id number]      DATE OF BIRTH:  [birth year]   PHYSICIAN:  Jake Shark A. Tanda Rockers, M.D. VISIT DATE:  05/28/2007                                   OFFICE VISIT   SUBJECTIVE:  Mr. Duaine Radin is a 50 year old man with a left  olecranon ulcerative bursitis.  In the interim, we treated him with  daily antiseptic soap washes followed by Lisette Grinder solution rinse and a  bulky dressing for protection.  He is accompanied by his caretaker.   OBJECTIVE:  Blood pressure is 85/52.  Respirations 16.  Pulse rate 97.  Temperature of 97.5.  Inspection of the left elbow shows that the wound is clean with 100%  granulation and minimum undermining.  There is advance in epithelium.  There is no evidence of active infection or joint effusion.  The range  of motion remains essentially unchanged.   ASSESSMENT:  Clinical improvement with local care as per above.   PLAN:  We will continue his daily antiseptic soap wash and Dakin  rinse.  We will reevaluate the patient in 3 weeks p.r.n.      [name], M.D.  Electronically Signed     HAN/MEDQ  D:  05/28/2007  T:  05/29/2007  Job:  [id number]

## 2010-08-21 NOTE — Op Note (Signed)
NAME:  [name] [name], [name] [name] NO.:  [id number]   MEDICAL RECORD NO.:  [id number]          PATIENT TYPE:  INP   LOCATION:  5036                         FACILITY:  [facility]   PHYSICIAN:  Deidre Ala, M.D.    DATE OF BIRTH:  05-Jul-1960   DATE OF PROCEDURE:  03/07/2007  DATE OF DISCHARGE:                               OPERATIVE REPORT   PREOPERATIVE DIAGNOSES:  1. Infected left elbow olecranon bursa, proximal forearm.  2. Decubitus ulcer 2-1/2 x 4 cm, superior lateral elbow.   POSTOPERATIVE DIAGNOSIS:  Same.   PROCEDURE:  1. Incision, drainage and lavage with debridement, proximal forearm      of olecranon bursa with bursectomy.  2. Placement, wound vac, olecranon bursa incision.  3. Decubitus ulcer, 2-1/2 x 4 cm.  4. Placement of wound vac at separate site over decubitus.   SURGEON:  Doristine Section, M.D.   ASSISTANT:  Phineas Semen,  [facility]   ANESTHESIA:  General with LMA.   CULTURES:  Cultures taken of fluid and blood.   DRAINS:  One wound VAC drains with a bridging sponge between the  decubitus and the main wound.   ESTIMATED BLOOD LOSS:  Less than 50 cc replacement, with tourniquet time  23 minutes.   PATHOLOGIC FINDINGS AND HISTORY:  [name] [name] is a 50 year old paraplegic, C7  level,  resident of a nursing home at [facility]. He has noticed a  several week history of increasing left elbow swelling.  I was consulted  yesterday by Dr. Candyce Churn for a swollen olecranon bursa.  He  had a decubitus with the above listed measurements that probably seeded  this area. That just came recently.  He is asensate over the dorsal arm.  The bursa was somewhat dumbbell shaped, but coalesced from the olecranon  bursa down the ulna and today was entered through a single longitudinal  wound of about 8 cm.  There was bursal tissue and subcutaneous fat that  was completely debrided on all the walls and down to the periosteum.  I  also debrided the decubitus which had a  purulent base into the  subcutaneum.  I then burred between the two wounds and placed a bridging  spine so that the wound vac over the main wound would  set both down.  After thorough debridement the  wound vac was placed in the two sites  with bridging between and suction applied.  Cultures were taken of a  bloody purulent effusate.  It was not frankly purulent. It was not  frankly bloody. It was some of both.   We will get the wound VAC changes done by the wound vac team in the  postoperative period and then he will convert back to the nursing home  for wound vac dressing change to let this heal by secondary intention.   DESCRIPTION OF PROCEDURE:  With adequate anesthesia obtained using LMA  technique,  the patient had been given unison prior. The patient was  placed in the supine position.  The left upper extremity was prepped  from the fingertips to the tourniquet in standard fashion.  After  standard prepping and draping, Esmarch exsanguination was used. The  tourniquet was let up to 250 mmHg.  We then made a longitudinal incision  over the bulging olecranon bursa that measured about 5 x 8 cm x 4 cm. He  had the  immediate rush of bloody purulent fluid which we cultured. I  then thoroughly sharply debrided the olecranon bursa, the subcutaneous  fat in the granulation tissue down to clean sidewalls and the periosteum  and all recesses.  I then debrided the decubitus, made a tunnel in  between, let the tourniquet down, cauterized bleeding points.  I then  cut the wound vac  sponges to the appropriate size and placed them in  the main wound, tucking them  into the recess and then across to the  decubitus underneath skin with a bridging sponge.  The wound vac shield  plastic was then placed on. Suction applied and the sponge was sucked  down.  Additional reinforcement proximally of the clear adhesive band  was used to achieve a good seal.  We then placed a bulky sterile  compressive  dressing and a sling.  The patient then having tolerated  procedure well was awakened, taken to recovery room in satisfactory  condition for postoperative care as above.           ______________________________  V. Charlesetta Shanks, M.D.     VEP/MEDQ  D:  03/07/2007  T:  03/07/2007  Job:  [id number]   cc:   Candyce Churn, M.D.  [facility]  [facility]
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
## 2010-08-21 NOTE — Assessment & Plan Note (Signed)
[facility]   NAME:  KALMAN, NYLEN NO.:  [id number]   MEDICAL RECORD NO.:  [id number]      DATE OF BIRTH:  Feb 02, 1961   PHYSICIAN:  Jake Shark A. Tanda Rockers, M.D. VISIT DATE:  04/30/2007                                   OFFICE VISIT   SUBJECTIVE:  [name] is a 50 year old man who was last seen in  October 2007. In the interim the patient has had an ulcerative bursitis  involving the left elbow drained.  His original surgery was March 06, 2007.  Since that time he has been treated as an open wound at the  [facility].  No records have accompanied him, but the patient  reports that there have been dressing changes on a day-to-day basis.  There has been no fever and no malodor.  The patient has an indwelling  PICC line and is receiving intravenous antibiotics of an unknown  variety.  The patient feels that the wound has not improved  significantly over the past month.  There has been no fever.   OBJECTIVE:  Blood pressure 70/60, respirations 16, pulse rate of 115,  temperature 97.4.  Inspection of the left elbow shows that there is an open wound in the  area of the bursa.  This has granulation tissue with significant  undermining. A photograph of this wound was taken. The radial pulse is  readily palpable. Atrophy of the thenar and hypothenar muscles of the  intrinsic muscles of the hands are self evident.  The patient has a  quadriparesis.   ASSESSMENT:  Ulcerative infective bursitis left elbow.   RECOMMENDATIONS:  We have written orders for twice a day antiseptic soap  wash and a thorough rinse with a 0.5% Dakin's solution.  We will apply  loose 4x4 packing to be held in position with a fish net.  We will  reevaluate the patient in 2 weeks.  The patient is to continue all of  his previously arranged followup appointments with his medical doctors  and we have requested that Charlynn Court forward copies of his recent  admissions  with his next visit.      [name], M.D.  Electronically Signed     HAN/MEDQ  D:  04/30/2007  T:  04/30/2007  Job:  [id number]   cc:   Charlynn Court
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
## 2010-08-24 NOTE — Assessment & Plan Note (Signed)
[facility]   NAME:  [name], [name] NO.:  [id number]   MEDICAL RECORD NO.:  [id number]      DATE OF BIRTH:  06-Oct-1960   PHYSICIAN:  Jake Shark A. Tanda Rockers, M.D. VISIT DATE:  01/08/2006                                     OFFICE VISIT   VITAL SIGNS:  Refer to the nurses' notes.   PURPOSE OF TODAY'S VISIT:  [name] is a 50 year old man who is a  resident at [facility] nursing home.  He has a near quadriplegic state.  He  has ulcerations on both of his lower extremities.  We have been following  the total of four wounds currently.  Wounds 1, 2 and 3 are located on the  left foot. Wound #4 is on the lateral malleolus of the right ankle.  He  denies drainage or fever.   WOUND EXAM:  Inspection of the lower extremity shows a persistence of 2-3+  edema on the right ankle. There is a circular ulceration over the malleolus  which is approximately 1.8 cm in diameter.  There appears to be a chronic  inflamed, granulating base with some clear expressible fluid.  There is no  malodor and it does not appear to be infected.  Wounds 2, 3, and 4 of the  left foot are clean with no evidence of purulent drainage or excessive  erythema.   WOUND SINCE LAST VISIT:   CHANGE IN INTERVAL MEDICAL HISTORY:   DIAGNOSIS:  There has been improvement of all four listed wounds above.  Wounds #5 and 6 from a previous examination have been resolved.   TREATMENT:   ANESTHETIC USED:   TISSUE DEBRIDED:   LEVEL:   CHANGE IN MEDS:   COMPRESSION BANDAGE:   OTHER:   MANAGEMENT PLAN & GOAL:  We will institute a Regranex protocol for his right  lateral malleolar ulceration and of the left foot.  We will continue a  moist, moist dressing.  We will reevaluate the patient in two weeks to  assess his response to the Regranex.           ______________________________  Theresia Majors Tanda Rockers, M.D.     [name] [name]  D:  01/08/2006  T:  01/10/2006  Job:  [id number]   cc:    [facility]

## 2010-08-24 NOTE — Assessment & Plan Note (Signed)
[facility]   NAME:  [name] [name], [name] [name] NO.:  [id number]   MEDICAL RECORD NO.:  [id number]      DATE OF BIRTH:  [birth year]   PHYSICIAN:  Jake Shark A. Tanda Rockers, M.D. VISIT DATE:  10/16/2005                                     OFFICE VISIT   VITAL SIGNS:  His vital signs are stable.  Blood pressure is 120/80, pulse  rate of 80, respirations of 28 and he is afebrile.   PURPOSE OF TODAY'S VISIT:  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  During the interim, he has been  treated with a local anesthetic cleansing and Kerlix wrap and Coban.  He  denies fever.   WOUND EXAM:  Inspection of wound #1 on the left dorsal foot shows that the  ulceration is clean with what appears to be healthy granulation.  Wound #2  on the left heel has a healthy granulating base with minimal necrosis.  Wound #3 on the left foot on the plantar surface has a healthy granulating  bed with some scant exudate but no significant necrosis.  On the left ankle,  the wound is contracting with margins of epithelialization advancing.  There  is no drainage.  There is no malodor.  None of the wounds required  debridement.   DIAGNOSIS:  Improving wounds with current therapy.   MANAGEMENT PLAN & GOAL:  We will continue moist-moist dressings with  utilizing saline.  We will use thick cotton sop on the right ankle and we  will wear a Hollister protective booty on the left foot.  We will reevaluate  the patient in one month p.r.n.           ______________________________  Theresia Majors. Tanda Rockers, M.D.     [name]  D:  10/16/2005  T:  10/16/2005  Job:  [id number]
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
## 2010-08-24 NOTE — Op Note (Signed)
NAME:  [name] [name], SEELMAN NO.:  [id number]   MEDICAL RECORD NO.:  [id number]                   PATIENT TYPE:  AMB   LOCATION:  ENDO                                 FACILITY:  [facility]   PHYSICIAN:  Graylin Shiver, M.D.                DATE OF BIRTH:  [birth year]   DATE OF PROCEDURE:  11/17/2003  DATE OF DISCHARGE:                                 OPERATIVE REPORT   PROCEDURE:  Colonoscopy with polypectomy.   INDICATIONS FOR PROCEDURE:  Rectal bleeding.   This patient is a 50 year old male who is quadriplegic.  His laxative  preparation required one gallon of __________ on the first day which did not  clean him out and then [employer] the second day. He had also been  on a clear liquid diet.   Informed consent was obtained after explanation of the risks of bleeding,  infection and perforation.   PREMEDICATION:  Fentanyl 62.5 mcg IV, Versed 6 mg IV.   DESCRIPTION OF PROCEDURE:  With the patient in the left lateral decubitus  position, a rectal exam was performed and no masses were felt. The Olympus  colonoscope was then inserted into the rectum and advanced around the colon  to the cecum. The colon was extremely long and tortuous.  It was very  difficult to get to the cecum. We had to place the patient on his right side  then on his back and apply pressure to multiple points in the abdomen. The  scope had a tendency to continue to recoil out. The prep was okay but there  was still stool present in the colon in certain portions.  The cecal  landmarks were identified. The cecum looked normal.  In the ascending colon,  there were two pedunculated polyps.  One was in the mid ascending colon,  this was snared and removed by snare cautery technique, it was approximately  6 mm in size.  I could not retrieve this polyp as it __________ away and I  could not advance the scope any further up the colon to find it.  In the  ascending colon right at about the  level of the hepatic flexure, there was a  1 cm pedunculated polyp which was snared and removed and by snare cautery  technique.  The cautery site looked good, the polyp was retrieved. The  transverse colon, descending colon, sigmoid and rectum looked normal. He  tolerated the procedure well without immediate complications.   IMPRESSION:  Two colon polyps as described above.   PLAN:  The pathology on one of the polyps which was retrieved will be  checked.                                               Graylin Shiver,  M.D.    SFG/MEDQ  D:  11/17/2003  T:  11/17/2003  Job:  [id number]   cc:   Dr. __________
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
## 2010-08-24 NOTE — Assessment & Plan Note (Signed)
[facility]   NAME:  [name] [name], [name] [name] NO.:  [id number]   MEDICAL RECORD NO.:  [id number]      DATE OF BIRTH:  Sep 30, 1960   PHYSICIAN:  Maxwell Caul, M.D.      VISIT DATE:                                     OFFICE VISIT   PURPOSE OF TODAY'S VISIT:  Follow up on bilateral lower extremity wounds.   [name] has a complex set of wounds on his bilateral lower extremities.  He currently is receiving Regranex to the right malleolar region and wet-to-  wet dressings to 3 other wounds on his left foot.   WOUND EXAM:  The right lateral malleolar wound we have labeled as 4.  This  has a clean granulating base; however, I am not sure there has been any  contraction on the Regranex.  Nevertheless, there may actually be some  beginning of epithelialization.  I will continue the Regranex for now to the  ulcer on the plantar aspect of his left heel.  This underwent a full-  thickness debridement with no anesthesia and direct pressure for control.  We removed a moderate amount of necrotic material.  The base of this appears  clean and not overtly infected; however, there is a lot of edema present  surrounding this and a lot of desquamating skin.  I have suggested a silver  alginate pack, which can be changed daily.  To the wound to the left  calcaneus at the Achilles tendon site, I have suggested a Promogran or  equivalent, which can be changed every second day.  Over his bilateral legs,  I would suggest Ace wraps to the lower part of his legs to control edema.  There is additionally a small wound on the ventral aspect of the left ankle.  This has an appearance of a stasis wound and hopefully just with control of  edema this will close over.  It is not granulated, but looks clean.   MANAGEMENT PLAN & GOAL:  We will see him back in 2 weeks' time to monitor  progress here.  I have written these orders out for the facility and they  can call for any  questions.           ______________________________  Maxwell Caul, M.D.     MGR/MEDQ  D:  01/22/2006  T:  01/23/2006  Job:  [id number]

## 2010-08-24 NOTE — Assessment & Plan Note (Signed)
[facility]   NAME:  [name] [name], [name] [name] NO.:  [id number]   MEDICAL RECORD NO.:  [id number]           DATE OF BIRTH:   PHYSICIAN:  Jonelle Sports. Sevier, M.D.  VISIT DATE:  10/01/2005                                     OFFICE VISIT   HISTORY:  This 50 year old black male quadriplegic who is actually employed,  but is a resident of [facility] has been followed  for numerous neuropathic pressure wounds on his lower extremities.   He reports that since last visit he has had no new problems.  The wounds, to  his way of thinking, seem to be better.  There has been no increased odor,  no pain, no fever or systemic symptoms.  He has had no change in  medications.   PHYSICAL EXAMINATION:  VITAL SIGNS:  Blood pressure 112/80, respirations 18,  pulse 80, temperature 98.6.   His wounds are as follows:  1.  On the dorsal lateral aspect of the left foot near the ankle wound      measuring 4 x 2.6 x 0.1 cm with a nice, clean granular base, some hint      of hypergranularity in the center of the wound.  2.  On the left heel posteriorly ulcer measuring 2.4 x 3.1 x 0.2 cm, again      with a nice granular base and no slough present.  3.  Wound on the left plantar heel measuring 1.5 x 2.7 and approximately 0.6      cm in depth with fairly calloused margins and with a 2 mm undermining      from approximately 10 o'clock to 2 o'clock.  4.  Wound on the right dorsal lateral foot again near the ankle measuring 1      x 1.6 x 0.4 cm in depth with crusty calloused margins, but a clean base.   IMPRESSION:  Progressive improvement four separate wounds of lower  extremities, pressure in nature in a neuropathic individual.   DISPOSITION:  1.  The wound on the plantar aspect of the left foot is partial thickness      debrided at its margins.  No attempt is made to unroof the minimal      undermined area.  The wound is treated with an application of  Neosporin      and __________, covered with a dry, protective dressing.   The remaining three wounds are treated with limited partial thickness  debridement of the one on the right ankle primarily to clean up its margins  and then all of those three wounds were treated with application of  Neosporin and Silverlon.   Both feet were placed in bulky protective wraps of Kerlix and Coban  extending from the base of the toes to the ankles.   Follow-up visit will be here in three weeks.           ______________________________  Jonelle Sports Cheryll Cockayne, M.D.     RES/MEDQ  D:  10/01/2005  T:  10/01/2005  Job:  [id number]

## 2010-08-24 NOTE — Assessment & Plan Note (Signed)
[facility]   NAME:  [name] [name], [name] [name] NO.:  [id number]   MEDICAL RECORD NO.:  [id number]      DATE OF BIRTH:  [birth year]   PHYSICIAN:  Jonelle Sports. Sevier, M.D.       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  These have occurred in the  face of diabetes and obviously are related as well to minor trauma and  probable pressure in those areas.   At last visit, which was his initial visit here the wounds were extremely  unclean, so were treated with extensive debridement and also applications of  Panafil.   He reports today that he feels the wounds are progressing satisfactorily.  He has had no appreciated pain.  No increase in drainage.  No malodor.  No  fever or systemic symptoms.  There has been no change in his medication.   EXAMINATION:  Blood pressure 103/60, heart rate 64, respirations 16, temp  98.3.   The wound on the lateral aspect of the right ankle now measures 1.1 x 2.0 x  0.2 cm and has a base covered with 100% slough but otherwise looks quite  satisfactory.   The wound on the left foot at the anterior ankle crease area now measures  4.1 x 3.8 x 0.2 cm and is dramatically cleaner with no necrotic areas and no  evidence of slough.   The wound on the plantar aspect of the left heel now measures 2.4 x 1.6 x  1.0 cm and is still involved with some slough and minimal necrotic tissue.  It has no significant odor.   The wound on the posterior aspect of the left heel measures 4.0 x 5.3 x 0.5  cm and again is strikingly cleaner than on previous visits.   IMPRESSION:  Improvement in multiple wounds of the lower extremities related  to neuropathy and diabetes.   DISPOSITION:  1.  The wound on the right ankle is full  thickness debrided of its slough      and this is well tolerated.  It bleeds freely.  2.  The wound on the anterior ankle crease on the left requires no      significant debridement.  3.  The wound on the posterior aspect of the left heel requires no specific      debridement.  4.  The wound on the plantar aspect of the left heel is full thickness      debrided of some slough and necrotic material.  5.  The wounds on the right ankle and the anterior ankle crease on the left      are treated with an application of prisma, covered by absorptive      dressings and a protective noncompressive wrap.  6.  The wound on the posterior aspect of the left heel is treated with an      application of Silverlon, an absorptive pad and again a noncompressive      wrap.  7.  The wound on the plantar aspect of  the left heel is treated with an      application of Panafil covered by a dry absorptive dressing.  Again that      extremity is placed in a noncompressive protective wrap.   The patient is placed in the heel pressure relief boot on the left.   Instructions are sent to the referring facility that the patient should have  the wound on the plantar aspect of the left foot changed daily with  cleansing and reapplication of Bactroban and absorptive dressing.  The other  three wounds should be changed every three days again with cleansing and  reapplication of dressings as at present.   A follow up visit will be here in 12 days.           ______________________________  Jonelle Sports Cheryll Cockayne, M.D.     RES/MEDQ  D:  09/19/2005  T:  09/19/2005  Job:  [id number]

## 2010-09-07 ENCOUNTER — Encounter: Payer: Self-pay | Admitting: Nurse Practitioner

## 2010-09-07 ENCOUNTER — Encounter: Payer: Self-pay | Admitting: Cardiothoracic Surgery

## 2010-09-16 ENCOUNTER — Emergency Department: Payer: Self-pay | Admitting: Emergency Medicine

## 2010-10-30 ENCOUNTER — Encounter: Payer: Self-pay | Admitting: Cardiothoracic Surgery

## 2010-10-31 ENCOUNTER — Emergency Department: Payer: Self-pay | Admitting: Emergency Medicine

## 2010-10-31 ENCOUNTER — Ambulatory Visit: Payer: Self-pay | Admitting: Oncology

## 2010-11-04 ENCOUNTER — Emergency Department: Payer: Self-pay | Admitting: *Deleted

## 2010-11-07 ENCOUNTER — Ambulatory Visit: Payer: Self-pay | Admitting: Oncology

## 2010-11-13 ENCOUNTER — Inpatient Hospital Stay: Payer: Self-pay | Admitting: Internal Medicine

## 2010-11-27 ENCOUNTER — Encounter: Payer: Self-pay | Admitting: Cardiothoracic Surgery

## 2010-12-05 ENCOUNTER — Ambulatory Visit: Payer: Self-pay | Admitting: Surgery

## 2010-12-18 ENCOUNTER — Inpatient Hospital Stay: Payer: Self-pay | Admitting: Specialist

## 2010-12-26 LAB — URINALYSIS, ROUTINE W REFLEX MICROSCOPIC
Glucose, UA: NEGATIVE
Nitrite: NEGATIVE
Protein, ur: NEGATIVE
pH: 5.5

## 2010-12-27 ENCOUNTER — Inpatient Hospital Stay
Admission: AD | Admit: 2010-12-27 | Discharge: 2011-01-28 | Disposition: A | Discharge status: Home Health | Payer: Self-pay | Source: Ambulatory Visit | Attending: Internal Medicine | Admitting: Internal Medicine

## 2010-12-27 ENCOUNTER — Ambulatory Visit (HOSPITAL_COMMUNITY)
Admission: RE | Admit: 2010-12-27 | Discharge: 2010-12-27 | Disposition: A | Discharge status: Home / Self Care | Payer: Medicare Other | Source: Other Acute Inpatient Hospital | Attending: Internal Medicine | Admitting: Internal Medicine

## 2010-12-27 LAB — DIFFERENTIAL
Basophils Absolute: 0 10*3/uL (ref 0.0–0.1)
Eosinophils Absolute: 0.2 10*3/uL (ref 0.0–0.7)
Eosinophils Relative: 3 % (ref 0–5)
Lymphs Abs: 2.3 10*3/uL (ref 0.7–4.0)
Monocytes Absolute: 0.4 10*3/uL (ref 0.1–1.0)
Neutrophils Relative %: 62 % (ref 43–77)

## 2010-12-27 LAB — HEPATIC FUNCTION PANEL
ALT: 10 U/L (ref 0–53)
Bilirubin, Direct: <0.1 mg/dL (ref 0.0–0.3)
Total Protein: 7.5 g/dL (ref 6.0–8.3)

## 2010-12-27 LAB — RENAL FUNCTION PANEL
Albumin: 2.7 g/dL — ABNORMAL LOW (ref 3.5–5.2)
Calcium: 8.7 mg/dL (ref 8.4–10.5)
GFR calc Af Amer: >60 mL/min (ref >60)
GFR calc non Af Amer: >60 mL/min (ref >60)
Phosphorus: 3.5 mg/dL (ref 2.3–4.6)
Potassium: 3.9 mEq/L (ref 3.5–5.1)
Sodium: 136 mEq/L (ref 135–145)

## 2010-12-27 LAB — CBC
Hemoglobin: 8.4 g/dL — ABNORMAL LOW (ref 13.0–17.0)
MCH: 21.6 pg — ABNORMAL LOW (ref 26.0–34.0)
MCHC: 31.2 g/dL (ref 30.0–36.0)
Platelets: 353 10*3/uL (ref 150–400)
RDW: 25.4 % — ABNORMAL HIGH (ref 11.5–15.5)

## 2010-12-31 LAB — URINALYSIS, ROUTINE W REFLEX MICROSCOPIC
Bilirubin Urine: NEGATIVE
Glucose, UA: NEGATIVE
Ketones, ur: NEGATIVE
Protein, ur: NEGATIVE
pH: 6

## 2010-12-31 LAB — RAPID URINE DRUG SCREEN, HOSP PERFORMED
Amphetamines: NOT DETECTED
Barbiturates: NOT DETECTED
Benzodiazepines: NOT DETECTED
Cocaine: NOT DETECTED
Opiates: POSITIVE — AB

## 2010-12-31 LAB — URINE CULTURE

## 2010-12-31 LAB — POCT CARDIAC MARKERS
CKMB, poc: 1.1
CKMB, poc: 1.5
Myoglobin, poc: 150
Troponin i, poc: <0.05
Troponin i, poc: <0.05

## 2010-12-31 LAB — COMPREHENSIVE METABOLIC PANEL
ALT: 12
AST: 16
Alkaline Phosphatase: 82
CO2: 26
Calcium: 8.9
GFR calc Af Amer: >60
GFR calc non Af Amer: >60
Glucose, Bld: 107 — ABNORMAL HIGH
Potassium: 4.1
Sodium: 138

## 2010-12-31 LAB — DIFFERENTIAL
Basophils Relative: 0
Eosinophils Absolute: 0
Eosinophils Relative: 0
Lymphs Abs: 2
Monocytes Relative: 1 — ABNORMAL LOW
Neutrophils Relative %: 86 — ABNORMAL HIGH

## 2010-12-31 LAB — ETHANOL: Alcohol, Ethyl (B): <5

## 2010-12-31 LAB — PREALBUMIN: Prealbumin: 19.9 mg/dL (ref 17.0–34.0)

## 2010-12-31 LAB — CBC
Hemoglobin: 11.6 — ABNORMAL LOW
MCHC: 31.1
RBC: 5.29
WBC: 14.5 — ABNORMAL HIGH

## 2010-12-31 LAB — CULTURE, BLOOD (ROUTINE X 2): Culture: NO GROWTH

## 2010-12-31 LAB — URINE MICROSCOPIC-ADD ON

## 2010-12-31 LAB — D-DIMER, QUANTITATIVE: D-Dimer, Quant: 1.74 — ABNORMAL HIGH

## 2011-01-01 LAB — LIPID PANEL
Triglycerides: 79
VLDL: 16

## 2011-01-01 LAB — CBC
HCT: 36.5 — ABNORMAL LOW
Hemoglobin: 11.5 — ABNORMAL LOW
Platelets: 286
Platelets: 330
RBC: 5.12
RDW: 17.5 — ABNORMAL HIGH
WBC: 10.4
WBC: 9.1

## 2011-01-01 LAB — TROPONIN I: Troponin I: 0.04

## 2011-01-01 LAB — COMPREHENSIVE METABOLIC PANEL
ALT: 9
AST: 16
Albumin: 2.9 — ABNORMAL LOW
Albumin: 3.2 — ABNORMAL LOW
Alkaline Phosphatase: 90
Alkaline Phosphatase: 90
BUN: 7
BUN: 9
CO2: 24
Chloride: 104
GFR calc Af Amer: >60
Glucose, Bld: 92
Potassium: 3.3 — ABNORMAL LOW
Potassium: 4.2
Sodium: 133 — ABNORMAL LOW
Total Bilirubin: 0.6
Total Protein: 7.9

## 2011-01-01 LAB — URINE CULTURE
Colony Count: NO GROWTH
Special Requests: NEGATIVE

## 2011-01-01 LAB — CARDIAC PANEL(CRET KIN+CKTOT+MB+TROPI)
CK, MB: 1.2
CK, MB: 1.3
CK, MB: 1.5
CK, MB: 1.9
Total CK: 69
Troponin I: 0.02
Troponin I: 0.02

## 2011-01-01 LAB — PROTIME-INR: Prothrombin Time: 12.7

## 2011-01-01 LAB — URINALYSIS, ROUTINE W REFLEX MICROSCOPIC
Bilirubin Urine: NEGATIVE
Ketones, ur: NEGATIVE
Nitrite: NEGATIVE
Protein, ur: NEGATIVE
Specific Gravity, Urine: 1.018
Urobilinogen, UA: 0.2
Urobilinogen, UA: 0.2

## 2011-01-01 LAB — URINE MICROSCOPIC-ADD ON

## 2011-01-01 LAB — DIFFERENTIAL
Basophils Relative: 2 — ABNORMAL HIGH
Eosinophils Relative: 5
Monocytes Absolute: 0.7
Monocytes Relative: 7
Neutro Abs: 6.7

## 2011-01-01 LAB — CULTURE, BLOOD (ROUTINE X 2)

## 2011-01-01 LAB — ETHANOL: Alcohol, Ethyl (B): <5

## 2011-01-01 LAB — D-DIMER, QUANTITATIVE: D-Dimer, Quant: 0.89 — ABNORMAL HIGH

## 2011-01-01 LAB — APTT: aPTT: 35

## 2011-01-01 LAB — HEMOGLOBIN A1C: Mean Plasma Glucose: 129

## 2011-01-03 LAB — BASIC METABOLIC PANEL
BUN: 9 mg/dL (ref 6–23)
Calcium: 9.2 mg/dL (ref 8.4–10.5)
Creatinine, Ser: 0.69 mg/dL (ref 0.50–1.35)
GFR calc non Af Amer: >60 mL/min (ref >60)
Glucose, Bld: 104 mg/dL — ABNORMAL HIGH (ref 70–99)
Sodium: 138 mEq/L (ref 135–145)

## 2011-01-03 LAB — URINALYSIS, ROUTINE W REFLEX MICROSCOPIC
Bilirubin Urine: NEGATIVE
Ketones, ur: NEGATIVE
Protein, ur: NEGATIVE
Urobilinogen, UA: 0.2

## 2011-01-03 LAB — URINE CULTURE

## 2011-01-05 ENCOUNTER — Other Ambulatory Visit (HOSPITAL_COMMUNITY): Payer: Self-pay

## 2011-01-05 LAB — CBC
Hemoglobin: 9.7 g/dL — ABNORMAL LOW (ref 13.0–17.0)
MCH: 20.9 pg — ABNORMAL LOW (ref 26.0–34.0)
MCHC: 30.8 g/dL (ref 30.0–36.0)
MCV: 67.7 fL — ABNORMAL LOW (ref 78.0–100.0)
Platelets: 375 10*3/uL (ref 150–400)

## 2011-01-05 LAB — CORTISOL-PM, BLOOD: Cortisol - PM: 16.7 ug/dL (ref 3.1–16.7)

## 2011-01-07 LAB — DIFFERENTIAL
Lymphocytes Relative: 37
Lymphs Abs: 2.4
Neutro Abs: 3.8
Neutrophils Relative %: 59

## 2011-01-07 LAB — COMPREHENSIVE METABOLIC PANEL
ALT: 11
AST: 42 — ABNORMAL HIGH
Albumin: 3.5
Alkaline Phosphatase: 90
BUN: 6
CO2: 19
Calcium: 8.8
Chloride: 108
Creatinine, Ser: 0.81
GFR calc Af Amer: >60
GFR calc non Af Amer: >60
Glucose, Bld: 103 — ABNORMAL HIGH
Potassium: 4.4
Sodium: 140
Total Bilirubin: 0.8
Total Protein: 7.2

## 2011-01-07 LAB — CROSSMATCH: ABO/RH(D): B POS

## 2011-01-07 LAB — BASIC METABOLIC PANEL
BUN: 19 mg/dL (ref 6–23)
Calcium: 9.1 mg/dL (ref 8.4–10.5)
GFR calc Af Amer: >90 mL/min (ref >90)
GFR calc non Af Amer: >90 mL/min (ref >90)
Potassium: 4.8 mEq/L (ref 3.5–5.1)

## 2011-01-07 LAB — CBC
HCT: 30 % — ABNORMAL LOW (ref 39.0–52.0)
HCT: 36.5 — ABNORMAL LOW
Hemoglobin: 11.1 — ABNORMAL LOW
MCH: 21.2 pg — ABNORMAL LOW (ref 26.0–34.0)
MCHC: 31.7 g/dL (ref 30.0–36.0)
MCHC: 30.3
MCV: 77.2 — ABNORMAL LOW
Platelets: 69 — ABNORMAL LOW
RBC: 4.73
RDW: 24.5 % — ABNORMAL HIGH (ref 11.5–15.5)
RDW: 16.5 — ABNORMAL HIGH
WBC: 6.5

## 2011-01-07 LAB — APTT: aPTT: 34

## 2011-01-07 LAB — PREALBUMIN: Prealbumin: 16.5 mg/dL — ABNORMAL LOW (ref 17.0–34.0)

## 2011-01-07 LAB — PROTIME-INR
INR: 0.9
Prothrombin Time: 12.1

## 2011-01-07 LAB — MAGNESIUM: Magnesium: 2.5 mg/dL (ref 1.5–2.5)

## 2011-01-08 LAB — POTASSIUM: Potassium: 3.8 mEq/L (ref 3.5–5.1)

## 2011-01-09 LAB — CBC
Platelets: 404 10*3/uL — ABNORMAL HIGH (ref 150–400)
RDW: 23.7 % — ABNORMAL HIGH (ref 11.5–15.5)
WBC: 8.8 10*3/uL (ref 4.0–10.5)

## 2011-01-09 LAB — URINALYSIS, ROUTINE W REFLEX MICROSCOPIC
Ketones, ur: 15 mg/dL — AB
Nitrite: NEGATIVE
Specific Gravity, Urine: 1.03 (ref 1.005–1.030)
Urobilinogen, UA: 0.2 mg/dL (ref 0.0–1.0)
pH: 7.5 (ref 5.0–8.0)

## 2011-01-09 LAB — BASIC METABOLIC PANEL
Chloride: 101 mEq/L (ref 96–112)
GFR calc Af Amer: >90 mL/min (ref >90)
Potassium: 3.8 mEq/L (ref 3.5–5.1)

## 2011-01-09 LAB — URINE MICROSCOPIC-ADD ON

## 2011-01-10 LAB — URINE CULTURE
Colony Count: 8000
Special Requests: NEGATIVE

## 2011-01-11 LAB — BASIC METABOLIC PANEL
BUN: 6 mg/dL (ref 6–23)
CO2: 23 mEq/L (ref 19–32)
Chloride: 106 mEq/L (ref 96–112)
Creatinine, Ser: 0.8 mg/dL (ref 0.4–1.5)
Glucose, Bld: 139 mg/dL — ABNORMAL HIGH (ref 70–99)
Potassium: 3.9 mEq/L (ref 3.5–5.1)

## 2011-01-11 LAB — GLUCOSE, CAPILLARY
Glucose-Capillary: 101 mg/dL — ABNORMAL HIGH (ref 70–99)
Glucose-Capillary: 102 mg/dL — ABNORMAL HIGH (ref 70–99)
Glucose-Capillary: 102 mg/dL — ABNORMAL HIGH (ref 70–99)
Glucose-Capillary: 105 mg/dL — ABNORMAL HIGH (ref 70–99)
Glucose-Capillary: 109 mg/dL — ABNORMAL HIGH (ref 70–99)
Glucose-Capillary: 109 mg/dL — ABNORMAL HIGH (ref 70–99)
Glucose-Capillary: 115 mg/dL — ABNORMAL HIGH (ref 70–99)
Glucose-Capillary: 115 mg/dL — ABNORMAL HIGH (ref 70–99)
Glucose-Capillary: 115 mg/dL — ABNORMAL HIGH (ref 70–99)
Glucose-Capillary: 120 mg/dL — ABNORMAL HIGH (ref 70–99)
Glucose-Capillary: 128 mg/dL — ABNORMAL HIGH (ref 70–99)
Glucose-Capillary: 15 mg/dL — CL (ref 70–99)
Glucose-Capillary: 85 mg/dL (ref 70–99)
Glucose-Capillary: 89 mg/dL (ref 70–99)
Glucose-Capillary: 93 mg/dL (ref 70–99)
Glucose-Capillary: 93 mg/dL (ref 70–99)
Glucose-Capillary: 96 mg/dL (ref 70–99)
Glucose-Capillary: 97 mg/dL (ref 70–99)
Glucose-Capillary: 99 mg/dL (ref 70–99)

## 2011-01-11 LAB — URINALYSIS, ROUTINE W REFLEX MICROSCOPIC
Glucose, UA: NEGATIVE mg/dL
Ketones, ur: 15 mg/dL — AB
Nitrite: POSITIVE — AB
Nitrite: POSITIVE — AB
Protein, ur: 30 mg/dL — AB
Protein, ur: >300 mg/dL — AB
Specific Gravity, Urine: 1.017 (ref 1.005–1.030)
Specific Gravity, Urine: 1.018 (ref 1.005–1.030)
Urobilinogen, UA: 0.2 mg/dL (ref 0.0–1.0)
Urobilinogen, UA: 1 mg/dL (ref 0.0–1.0)

## 2011-01-11 LAB — COMPREHENSIVE METABOLIC PANEL
ALT: <8 U/L (ref 0–53)
AST: 14 U/L (ref 0–37)
Albumin: 3.2 g/dL — ABNORMAL LOW (ref 3.5–5.2)
Alkaline Phosphatase: 70 U/L (ref 39–117)
Chloride: 104 mEq/L (ref 96–112)
GFR calc Af Amer: >60 mL/min (ref >60)
Potassium: 3.9 mEq/L (ref 3.5–5.1)
Sodium: 141 mEq/L (ref 135–145)
Total Bilirubin: 0.5 mg/dL (ref 0.3–1.2)

## 2011-01-11 LAB — ETHANOL: Alcohol, Ethyl (B): 5 mg/dL (ref 0–10)

## 2011-01-11 LAB — CBC
HCT: 37.6 % — ABNORMAL LOW (ref 39.0–52.0)
HCT: 44.9 % (ref 39.0–52.0)
HCT: 45.9 % (ref 39.0–52.0)
MCHC: 30 g/dL (ref 30.0–36.0)
MCHC: 30.1 g/dL (ref 30.0–36.0)
MCV: 73.2 fL — ABNORMAL LOW (ref 78.0–100.0)
MCV: 73.7 fL — ABNORMAL LOW (ref 78.0–100.0)
Platelets: 226 10*3/uL (ref 150–400)
Platelets: 228 10*3/uL (ref 150–400)
RBC: 6.22 MIL/uL — ABNORMAL HIGH (ref 4.22–5.81)
RDW: 15.9 % — ABNORMAL HIGH (ref 11.5–15.5)
WBC: 10 10*3/uL (ref 4.0–10.5)
WBC: 10.7 10*3/uL — ABNORMAL HIGH (ref 4.0–10.5)
WBC: 7.8 10*3/uL (ref 4.0–10.5)

## 2011-01-11 LAB — IRON AND TIBC
Iron: 30 ug/dL — ABNORMAL LOW (ref 42–135)
TIBC: 237 ug/dL (ref 215–435)

## 2011-01-11 LAB — DIFFERENTIAL
Basophils Relative: 0 % (ref 0–1)
Eosinophils Absolute: 0 10*3/uL (ref 0.0–0.7)
Eosinophils Absolute: 0.1 10*3/uL (ref 0.0–0.7)
Eosinophils Relative: 0 % (ref 0–5)
Lymphs Abs: 1.3 10*3/uL (ref 0.7–4.0)
Lymphs Abs: 1.8 10*3/uL (ref 0.7–4.0)
Monocytes Absolute: 0.4 10*3/uL (ref 0.1–1.0)
Monocytes Absolute: 0.4 10*3/uL (ref 0.1–1.0)
Neutrophils Relative %: 77 % (ref 43–77)

## 2011-01-11 LAB — URINE MICROSCOPIC-ADD ON

## 2011-01-11 LAB — URINE CULTURE

## 2011-01-11 LAB — PROTIME-INR
INR: 1.7 — ABNORMAL HIGH (ref 0.00–1.49)
INR: 2.6 — ABNORMAL HIGH (ref 0.00–1.49)
Prothrombin Time: 12.9 seconds (ref 11.6–15.2)
Prothrombin Time: 20.3 seconds — ABNORMAL HIGH (ref 11.6–15.2)

## 2011-01-11 LAB — POCT I-STAT, CHEM 8
BUN: 9 mg/dL (ref 6–23)
Calcium, Ion: 1.08 mmol/L — ABNORMAL LOW (ref 1.12–1.32)
Chloride: 104 mEq/L (ref 96–112)
Creatinine, Ser: 1 mg/dL (ref 0.4–1.5)
Sodium: 141 mEq/L (ref 135–145)
TCO2: 26 mmol/L (ref 0–100)

## 2011-01-11 LAB — HEMOGLOBIN A1C: Mean Plasma Glucose: 111 mg/dL

## 2011-01-11 LAB — GLUCOSE, RANDOM: Glucose, Bld: 113 mg/dL — ABNORMAL HIGH (ref 70–99)

## 2011-01-11 LAB — TSH: TSH: 0.333 u[IU]/mL — ABNORMAL LOW (ref 0.350–4.500)

## 2011-01-11 LAB — HEMOGLOBIN AND HEMATOCRIT, BLOOD
HCT: 36.6 % — ABNORMAL LOW (ref 39.0–52.0)
Hemoglobin: 12 g/dL — ABNORMAL LOW (ref 13.0–17.0)

## 2011-01-11 LAB — CK TOTAL AND CKMB (NOT AT ARMC): Total CK: 153 U/L (ref 7–232)

## 2011-01-12 LAB — CBC
HCT: 28.5 % — ABNORMAL LOW (ref 39.0–52.0)
MCHC: 30.9 g/dL (ref 30.0–36.0)
RDW: 23.6 % — ABNORMAL HIGH (ref 11.5–15.5)

## 2011-01-12 LAB — BASIC METABOLIC PANEL
BUN: 10 mg/dL (ref 6–23)
GFR calc Af Amer: >90 mL/min (ref >90)
GFR calc non Af Amer: >90 mL/min (ref >90)
Potassium: 3.9 mEq/L (ref 3.5–5.1)
Sodium: 137 mEq/L (ref 135–145)

## 2011-01-14 LAB — BASIC METABOLIC PANEL
CO2: 27
CO2: 28
Calcium: 8.6
Chloride: 101
GFR calc Af Amer: >60
GFR calc non Af Amer: >60
Glucose, Bld: 98
Potassium: 3.9
Sodium: 134 — ABNORMAL LOW
Sodium: 138

## 2011-01-14 LAB — CBC
HCT: 33.1 — ABNORMAL LOW
Hemoglobin: 10.6 — ABNORMAL LOW
Hemoglobin: 11.3 — ABNORMAL LOW
MCHC: 32
MCHC: 32.1
MCHC: 32.2
MCV: 74.6 — ABNORMAL LOW
RBC: 4.67
RBC: 4.73
RDW: 14.4
WBC: 5.9

## 2011-01-15 LAB — BASIC METABOLIC PANEL
CO2: 25
CO2: 27
Chloride: 100
Chloride: 103
Creatinine, Ser: 0.66
Creatinine, Ser: 0.74
GFR calc Af Amer: >60
GFR calc Af Amer: >60
Potassium: 3.8
Sodium: 132 — ABNORMAL LOW

## 2011-01-15 LAB — WOUND CULTURE: Gram Stain: NONE SEEN

## 2011-01-15 LAB — CBC
HCT: 35.2 — ABNORMAL LOW
HCT: 36.6 — ABNORMAL LOW
Hemoglobin: 11.2 — ABNORMAL LOW
Hemoglobin: 11.4 — ABNORMAL LOW
MCHC: 31.7
MCHC: 31.9
MCHC: 32.2
MCV: 75.2 — ABNORMAL LOW
MCV: 75.2 — ABNORMAL LOW
Platelets: 341
RBC: 4.68
RBC: 4.78
WBC: 6.4
WBC: 7

## 2011-01-15 LAB — DIFFERENTIAL
Basophils Relative: 0
Eosinophils Absolute: 0.1 — ABNORMAL LOW
Eosinophils Relative: 1
Monocytes Relative: 7
Neutrophils Relative %: 73

## 2011-01-15 LAB — I-STAT 8, (EC8 V) (CONVERTED LAB)
Bicarbonate: 23.7
Glucose, Bld: 139 — ABNORMAL HIGH
Hemoglobin: 13.6
Operator id: 285841
Sodium: 138
TCO2: 25
pCO2, Ven: 38.2 — ABNORMAL LOW

## 2011-01-15 LAB — TISSUE CULTURE

## 2011-01-15 LAB — HEMOGLOBIN A1C
Hgb A1c MFr Bld: 6.3 — ABNORMAL HIGH
Mean Plasma Glucose: 147

## 2011-01-15 LAB — VITAMIN B12: Vitamin B-12: 654 (ref 211–911)

## 2011-01-15 LAB — LIPID PANEL
LDL Cholesterol: 70
Triglycerides: 125
VLDL: 25

## 2011-01-15 LAB — ANAEROBIC CULTURE

## 2011-01-15 LAB — TSH: TSH: 1.323

## 2011-01-16 LAB — BASIC METABOLIC PANEL
BUN: 16 mg/dL (ref 6–23)
Calcium: 9 mg/dL (ref 8.4–10.5)
GFR calc Af Amer: >90 mL/min (ref >90)
GFR calc non Af Amer: >90 mL/min (ref >90)
Glucose, Bld: 99 mg/dL (ref 70–99)
Potassium: 3.8 mEq/L (ref 3.5–5.1)

## 2011-01-16 LAB — CBC
HCT: 28.2 % — ABNORMAL LOW (ref 39.0–52.0)
Hemoglobin: 8.7 g/dL — ABNORMAL LOW (ref 13.0–17.0)
MCH: 20.1 pg — ABNORMAL LOW (ref 26.0–34.0)
MCHC: 30.9 g/dL (ref 30.0–36.0)
RDW: 23.4 % — ABNORMAL HIGH (ref 11.5–15.5)

## 2011-01-19 LAB — CBC
MCH: 19.8 pg — ABNORMAL LOW (ref 26.0–34.0)
MCHC: 30.5 g/dL (ref 30.0–36.0)
Platelets: 320 10*3/uL (ref 150–400)
RDW: 23 % — ABNORMAL HIGH (ref 11.5–15.5)

## 2011-01-19 LAB — BASIC METABOLIC PANEL
Calcium: 9.2 mg/dL (ref 8.4–10.5)
GFR calc Af Amer: >90 mL/min (ref >90)
GFR calc non Af Amer: >90 mL/min (ref >90)
Glucose, Bld: 109 mg/dL — ABNORMAL HIGH (ref 70–99)
Potassium: 3.9 mEq/L (ref 3.5–5.1)
Sodium: 137 mEq/L (ref 135–145)

## 2011-01-19 LAB — MAGNESIUM: Magnesium: 2.3 mg/dL (ref 1.5–2.5)

## 2011-01-21 LAB — PREALBUMIN: Prealbumin: 13.6 mg/dL — ABNORMAL LOW (ref 17.0–34.0)

## 2011-01-22 LAB — BASIC METABOLIC PANEL
CO2: 26 mEq/L (ref 19–32)
Calcium: 9.5 mg/dL (ref 8.4–10.5)
Creatinine, Ser: 0.69 mg/dL (ref 0.50–1.35)
GFR calc non Af Amer: >90 mL/min (ref >90)
Sodium: 139 mEq/L (ref 135–145)

## 2011-01-24 LAB — CBC
MCV: 64.4 fL — ABNORMAL LOW (ref 78.0–100.0)
Platelets: 330 10*3/uL (ref 150–400)
RDW: 22.7 % — ABNORMAL HIGH (ref 11.5–15.5)
WBC: 10.7 10*3/uL — ABNORMAL HIGH (ref 4.0–10.5)

## 2011-01-24 LAB — BASIC METABOLIC PANEL
Calcium: 9.3 mg/dL (ref 8.4–10.5)
Chloride: 102 mEq/L (ref 96–112)
Creatinine, Ser: 0.68 mg/dL (ref 0.50–1.35)
GFR calc Af Amer: >90 mL/min (ref >90)
Sodium: 137 mEq/L (ref 135–145)

## 2011-01-24 LAB — MAGNESIUM: Magnesium: 2.3 mg/dL (ref 1.5–2.5)

## 2011-01-28 LAB — BASIC METABOLIC PANEL
Calcium: 9.3 mg/dL (ref 8.4–10.5)
Chloride: 104 mEq/L (ref 96–112)
Creatinine, Ser: 0.69 mg/dL (ref 0.50–1.35)
GFR calc Af Amer: >90 mL/min (ref >90)
Sodium: 138 mEq/L (ref 135–145)

## 2011-01-28 LAB — PREALBUMIN: Prealbumin: 12.5 mg/dL — ABNORMAL LOW (ref 17.0–34.0)
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
## 2011-06-03 ENCOUNTER — Emergency Department: Payer: Self-pay | Admitting: Emergency Medicine

## 2011-06-03 LAB — COMPREHENSIVE METABOLIC PANEL
Anion Gap: 8 (ref 7–16)
BUN: 11 mg/dL (ref 7–18)
Calcium, Total: 9.5 mg/dL (ref 8.5–10.1)
Chloride: 101 mmol/L (ref 98–107)
Co2: 28 mmol/L (ref 21–32)
EGFR (African American): >60
EGFR (Non-African Amer.): >60
Potassium: 3.9 mmol/L (ref 3.5–5.1)
SGOT(AST): 19 U/L (ref 15–37)
SGPT (ALT): 16 U/L
Total Protein: 9.2 g/dL — ABNORMAL HIGH (ref 6.4–8.2)

## 2011-06-03 LAB — CBC
HCT: 39.1 % — ABNORMAL LOW (ref 40.0–52.0)
MCV: 64 fL — ABNORMAL LOW (ref 80–100)
Platelet: 203 10*3/uL (ref 150–440)
RBC: 6.14 10*6/uL — ABNORMAL HIGH (ref 4.40–5.90)
WBC: 8.8 10*3/uL (ref 3.8–10.6)

## 2011-06-03 LAB — TROPONIN I: Troponin-I: <0.02 ng/mL

## 2011-06-03 LAB — URINALYSIS, COMPLETE
Glucose,UR: NEGATIVE mg/dL (ref 0–75)
Protein: NEGATIVE
Specific Gravity: 1.003 (ref 1.003–1.030)
Squamous Epithelial: NONE SEEN

## 2011-06-03 LAB — DIFFERENTIAL
Basophil #: 0 10*3/uL (ref 0.0–0.1)
Eosinophil %: 0.4 %
Lymphocyte #: 0.8 10*3/uL — ABNORMAL LOW (ref 1.0–3.6)
Lymphocyte %: 9.2 %
Monocyte %: 1.3 %
Neutrophil #: 7.8 10*3/uL — ABNORMAL HIGH (ref 1.4–6.5)

## 2011-06-03 LAB — CK TOTAL AND CKMB (NOT AT ARMC)
CK, Total: 292 U/L — ABNORMAL HIGH (ref 35–232)
CK-MB: 2.1 ng/mL (ref 0.5–3.6)

## 2011-06-09 LAB — CULTURE, BLOOD (SINGLE)

## 2011-06-20 ENCOUNTER — Emergency Department: Payer: Self-pay | Admitting: Emergency Medicine

## 2011-06-20 LAB — COMPREHENSIVE METABOLIC PANEL
Anion Gap: 12 (ref 7–16)
Calcium, Total: 8.9 mg/dL (ref 8.5–10.1)
Chloride: 98 mmol/L (ref 98–107)
Co2: 24 mmol/L (ref 21–32)
Creatinine: 0.92 mg/dL (ref 0.60–1.30)
EGFR (African American): >60
EGFR (Non-African Amer.): >60
Potassium: 3.8 mmol/L (ref 3.5–5.1)
SGOT(AST): 21 U/L (ref 15–37)
SGPT (ALT): 11 U/L — ABNORMAL LOW
Total Protein: 8.7 g/dL — ABNORMAL HIGH (ref 6.4–8.2)

## 2011-06-20 LAB — URINALYSIS, COMPLETE
Glucose,UR: NEGATIVE mg/dL (ref 0–75)
Nitrite: NEGATIVE
Ph: 6 (ref 4.5–8.0)
Specific Gravity: 1.002 (ref 1.003–1.030)

## 2011-06-20 LAB — CBC
HGB: 10.1 g/dL — ABNORMAL LOW (ref 13.0–18.0)
MCH: 19.1 pg — ABNORMAL LOW (ref 26.0–34.0)
RBC: 5.3 10*6/uL (ref 4.40–5.90)
WBC: 9.2 10*3/uL (ref 3.8–10.6)

## 2011-06-23 LAB — URINE CULTURE

## 2011-06-26 LAB — CULTURE, BLOOD (SINGLE)

## 2011-07-08 ENCOUNTER — Ambulatory Visit: Payer: Self-pay | Admitting: Oncology

## 2011-07-08 DIAGNOSIS — I469 Cardiac arrest, cause unspecified: Secondary | ICD-10-CM

## 2011-07-08 HISTORY — DX: Cardiac arrest, cause unspecified: I46.9

## 2011-07-26 ENCOUNTER — Inpatient Hospital Stay: Payer: Self-pay | Admitting: General Surgery

## 2011-07-26 LAB — IRON AND TIBC
Iron Bind.Cap.(Total): 241 ug/dL — ABNORMAL LOW (ref 250–450)
Iron Saturation: 7 %
Iron: 17 ug/dL — ABNORMAL LOW (ref 65–175)

## 2011-07-26 LAB — CBC
HCT: 16.4 % — ABNORMAL LOW (ref 40.0–52.0)
MCH: 18 pg — ABNORMAL LOW (ref 26.0–34.0)
MCHC: 29 g/dL — ABNORMAL LOW (ref 32.0–36.0)
MCV: 62 fL — ABNORMAL LOW (ref 80–100)

## 2011-07-26 LAB — URINALYSIS, COMPLETE
Glucose,UR: NEGATIVE mg/dL (ref 0–75)
Hyaline Cast: 46
Ketone: NEGATIVE
RBC,UR: 33 /HPF (ref 0–5)
Squamous Epithelial: 3

## 2011-07-26 LAB — COMPREHENSIVE METABOLIC PANEL
Albumin: 2.3 g/dL — ABNORMAL LOW (ref 3.4–5.0)
BUN: 15 mg/dL (ref 7–18)
Bilirubin,Total: 0.2 mg/dL (ref 0.2–1.0)
Calcium, Total: 8.2 mg/dL — ABNORMAL LOW (ref 8.5–10.1)
Chloride: 105 mmol/L (ref 98–107)
EGFR (African American): >60
Glucose: 141 mg/dL — ABNORMAL HIGH (ref 65–99)
Osmolality: 281 (ref 275–301)
Potassium: 3.5 mmol/L (ref 3.5–5.1)
SGOT(AST): 18 U/L (ref 15–37)
SGPT (ALT): 18 U/L
Sodium: 139 mmol/L (ref 136–145)

## 2011-07-26 LAB — RETICULOCYTES
Absolute Retic Count: 0.1027 10*6/uL — ABNORMAL HIGH (ref 0.024–0.084)
Reticulocyte: 3.78 % — ABNORMAL HIGH (ref 0.5–1.5)

## 2011-07-26 LAB — DIFFERENTIAL
Basophil %: 0.2 %
Eosinophil #: 0.1 10*3/uL (ref 0.0–0.7)
Eosinophil %: 0.4 %
Monocyte #: 0.5 x10 3/mm (ref 0.2–1.0)
Monocyte %: 2.6 %
Neutrophil #: 15.8 10*3/uL — ABNORMAL HIGH (ref 1.4–6.5)
Neutrophil %: 91.4 %

## 2011-07-26 LAB — APTT: Activated PTT: 32.6 secs (ref 23.6–35.9)

## 2011-07-26 LAB — LACTATE DEHYDROGENASE: LDH: 135 U/L (ref 87–241)

## 2011-07-26 LAB — SEDIMENTATION RATE: Erythrocyte Sed Rate: >140 mm/hr — ABNORMAL HIGH (ref 0–20)

## 2011-07-26 LAB — FERRITIN: Ferritin (ARMC): 46 ng/mL (ref 8–388)

## 2011-07-27 DIAGNOSIS — R079 Chest pain, unspecified: Secondary | ICD-10-CM

## 2011-07-27 LAB — CBC WITH DIFFERENTIAL/PLATELET
Eosinophil %: 0.4 %
HGB: 7.1 g/dL — ABNORMAL LOW (ref 13.0–18.0)
Lymphocyte #: 1.3 10*3/uL (ref 1.0–3.6)
MCV: 73 fL — ABNORMAL LOW (ref 80–100)
Monocyte #: 0.7 x10 3/mm (ref 0.2–1.0)
Neutrophil %: 84.6 %
Platelet: 369 10*3/uL (ref 150–440)
RBC: 3.13 10*6/uL — ABNORMAL LOW (ref 4.40–5.90)

## 2011-07-27 LAB — BASIC METABOLIC PANEL
Anion Gap: 9 (ref 7–16)
Calcium, Total: 8 mg/dL — ABNORMAL LOW (ref 8.5–10.1)
Chloride: 104 mmol/L (ref 98–107)
Co2: 27 mmol/L (ref 21–32)
Creatinine: 0.72 mg/dL (ref 0.60–1.30)
EGFR (Non-African Amer.): >60
Glucose: 127 mg/dL — ABNORMAL HIGH (ref 65–99)

## 2011-07-28 LAB — CBC WITH DIFFERENTIAL/PLATELET
Basophil #: 0 10*3/uL (ref 0.0–0.1)
Basophil %: 0.2 %
Eosinophil %: 1 %
HGB: 7.1 g/dL — ABNORMAL LOW (ref 13.0–18.0)
Lymphocyte #: 1.3 10*3/uL (ref 1.0–3.6)
Lymphocyte %: 12.1 %
MCH: 24.3 pg — ABNORMAL LOW (ref 26.0–34.0)
Monocyte %: 4.4 %
Neutrophil #: 8.8 10*3/uL — ABNORMAL HIGH (ref 1.4–6.5)
Neutrophil %: 82.3 %
Platelet: 368 10*3/uL (ref 150–440)
WBC: 10.6 10*3/uL (ref 3.8–10.6)

## 2011-07-29 LAB — HEMOGLOBIN: HGB: 7.5 g/dL — ABNORMAL LOW (ref 13.0–18.0)

## 2011-07-30 LAB — PROTIME-INR: INR: 1.1

## 2011-07-30 LAB — HEMOGLOBIN: HGB: 9.4 g/dL — ABNORMAL LOW (ref 13.0–18.0)

## 2011-07-31 LAB — CULTURE, BLOOD (SINGLE)

## 2011-08-03 LAB — BASIC METABOLIC PANEL
Calcium, Total: 8.3 mg/dL — ABNORMAL LOW (ref 8.5–10.1)
EGFR (Non-African Amer.): >60
Glucose: 115 mg/dL — ABNORMAL HIGH (ref 65–99)
Potassium: 4.1 mmol/L (ref 3.5–5.1)

## 2011-08-03 LAB — CBC WITH DIFFERENTIAL/PLATELET
Eosinophil %: 0.5 %
Lymphocyte #: 1.4 10*3/uL (ref 1.0–3.6)
Lymphocyte %: 10.8 %
MCH: 24.2 pg — ABNORMAL LOW (ref 26.0–34.0)
Monocyte #: 0.7 x10 3/mm (ref 0.2–1.0)
Monocyte %: 5.9 %

## 2011-08-04 LAB — CBC WITH DIFFERENTIAL/PLATELET
Basophil #: 0 10*3/uL (ref 0.0–0.1)
Eosinophil #: 0.1 10*3/uL (ref 0.0–0.7)
Eosinophil %: 0.6 %
HCT: 30.3 % — ABNORMAL LOW (ref 40.0–52.0)
HCT: 28.7 % — ABNORMAL LOW (ref 40.0–52.0)
HGB: 9.8 g/dL — ABNORMAL LOW (ref 13.0–18.0)
HGB: 9.1 g/dL — ABNORMAL LOW (ref 13.0–18.0)
Lymphocyte #: 1.2 10*3/uL (ref 1.0–3.6)
Lymphocyte %: 7.5 %
Lymphocyte %: 8.6 %
Lymphocytes: 7 %
MCH: 24.3 pg — ABNORMAL LOW (ref 26.0–34.0)
MCH: 23.7 pg — ABNORMAL LOW (ref 26.0–34.0)
MCHC: 32.2 g/dL (ref 32.0–36.0)
MCV: 75 fL — ABNORMAL LOW (ref 80–100)
Monocyte #: 0.8 x10 3/mm (ref 0.2–1.0)
Monocyte %: 5.8 %
Monocytes: 6 %
Neutrophil %: 86.4 %
Neutrophil %: 85 %
Platelet: 346 10*3/uL (ref 150–440)
RDW: 25.7 % — ABNORMAL HIGH (ref 11.5–14.5)
RDW: 25.8 % — ABNORMAL HIGH (ref 11.5–14.5)
Segmented Neutrophils: 84 %
WBC: 14.3 10*3/uL — ABNORMAL HIGH (ref 3.8–10.6)

## 2011-08-05 HISTORY — PX: GASTRIC RESECTION: SHX5248

## 2011-08-05 LAB — CBC WITH DIFFERENTIAL/PLATELET
Basophil #: 0.1 10*3/uL (ref 0.0–0.1)
Basophil %: 0.5 %
Eosinophil %: 0.8 %
MCV: 75 fL — ABNORMAL LOW (ref 80–100)
Monocyte %: 6 %
Neutrophil #: 8.2 10*3/uL — ABNORMAL HIGH (ref 1.4–6.5)
Neutrophil %: 77.9 %
Platelet: 363 10*3/uL (ref 150–440)
RDW: 25.9 % — ABNORMAL HIGH (ref 11.5–14.5)

## 2011-08-05 LAB — BASIC METABOLIC PANEL
Chloride: 104 mmol/L (ref 98–107)
Co2: 26 mmol/L (ref 21–32)
Creatinine: 1.17 mg/dL (ref 0.60–1.30)
EGFR (African American): >60
EGFR (Non-African Amer.): >60
Glucose: 89 mg/dL (ref 65–99)
Osmolality: 271 (ref 275–301)

## 2011-08-06 LAB — CBC WITH DIFFERENTIAL/PLATELET
Basophil #: 0.1 10*3/uL (ref 0.0–0.1)
Basophil %: 0.3 %
Eosinophil #: 0 10*3/uL (ref 0.0–0.7)
Eosinophil %: 0.2 %
HCT: 27.4 % — ABNORMAL LOW (ref 40.0–52.0)
HCT: 29.4 % — ABNORMAL LOW (ref 40.0–52.0)
Lymphocyte %: 12.3 %
MCH: 23.6 pg — ABNORMAL LOW (ref 26.0–34.0)
Monocyte #: 0.4 x10 3/mm (ref 0.2–1.0)
Monocyte #: 0.4 x10 3/mm (ref 0.2–1.0)
Monocyte %: 5.4 %
Neutrophil #: 7 10*3/uL — ABNORMAL HIGH (ref 1.4–6.5)
Neutrophil #: 7.6 10*3/uL — ABNORMAL HIGH (ref 1.4–6.5)
Neutrophil %: 89.7 %
Platelet: 342 10*3/uL (ref 150–440)
RBC: 3.64 10*6/uL — ABNORMAL LOW (ref 4.40–5.90)
RDW: 25.8 % — ABNORMAL HIGH (ref 11.5–14.5)
RDW: 26.1 % — ABNORMAL HIGH (ref 11.5–14.5)

## 2011-08-06 LAB — COMPREHENSIVE METABOLIC PANEL
Albumin: 2.3 g/dL — ABNORMAL LOW (ref 3.4–5.0)
Anion Gap: 7 (ref 7–16)
BUN: 9 mg/dL (ref 7–18)
Bilirubin,Total: 0.3 mg/dL (ref 0.2–1.0)
Chloride: 103 mmol/L (ref 98–107)
Creatinine: 1.28 mg/dL (ref 0.60–1.30)
EGFR (African American): >60
EGFR (Non-African Amer.): >60
Glucose: 144 mg/dL — ABNORMAL HIGH (ref 65–99)
Osmolality: 275 (ref 275–301)
SGPT (ALT): 10 U/L — ABNORMAL LOW
Sodium: 137 mmol/L (ref 136–145)

## 2011-08-06 LAB — MAGNESIUM: Magnesium: 2.1 mg/dL

## 2011-08-06 LAB — CK TOTAL AND CKMB (NOT AT ARMC): CK, Total: 876 U/L — ABNORMAL HIGH (ref 35–232)

## 2011-08-07 ENCOUNTER — Ambulatory Visit: Payer: Self-pay | Admitting: Internal Medicine

## 2011-08-07 ENCOUNTER — Ambulatory Visit: Payer: Self-pay | Admitting: Oncology

## 2011-08-07 LAB — URINALYSIS, COMPLETE
Bacteria: NONE SEEN
Ketone: NEGATIVE
Leukocyte Esterase: NEGATIVE
Nitrite: NEGATIVE
Ph: 5 (ref 4.5–8.0)
RBC,UR: 4 /HPF (ref 0–5)
WBC UR: 6 /HPF (ref 0–5)

## 2011-08-08 LAB — CBC WITH DIFFERENTIAL/PLATELET
Basophil #: 0.1 10*3/uL (ref 0.0–0.1)
HCT: 23 % — ABNORMAL LOW (ref 40.0–52.0)
HGB: 7.4 g/dL — ABNORMAL LOW (ref 13.0–18.0)
Lymphocyte %: 7.8 %
MCV: 75 fL — ABNORMAL LOW (ref 80–100)
Monocyte #: 0.3 x10 3/mm (ref 0.2–1.0)
Monocyte %: 8.8 %
Neutrophil #: 3 10*3/uL (ref 1.4–6.5)
Neutrophil %: 81.2 %
Platelet: 293 10*3/uL (ref 150–440)
RBC: 3.06 10*6/uL — ABNORMAL LOW (ref 4.40–5.90)
RDW: 27.2 % — ABNORMAL HIGH (ref 11.5–14.5)
WBC: 3.7 10*3/uL — ABNORMAL LOW (ref 3.8–10.6)

## 2011-08-08 LAB — MAGNESIUM: Magnesium: 1.7 mg/dL — ABNORMAL LOW

## 2011-08-08 LAB — BASIC METABOLIC PANEL
Anion Gap: 7 (ref 7–16)
Calcium, Total: 7.9 mg/dL — ABNORMAL LOW (ref 8.5–10.1)
Chloride: 108 mmol/L — ABNORMAL HIGH (ref 98–107)
Co2: 26 mmol/L (ref 21–32)
Creatinine: 1.01 mg/dL (ref 0.60–1.30)
EGFR (African American): >60
Glucose: 225 mg/dL — ABNORMAL HIGH (ref 65–99)
Osmolality: 287 (ref 275–301)
Sodium: 141 mmol/L (ref 136–145)

## 2011-08-08 LAB — HEMOGLOBIN: HGB: 7.5 g/dL — ABNORMAL LOW (ref 13.0–18.0)

## 2011-08-08 LAB — PATHOLOGY REPORT

## 2011-08-09 LAB — BASIC METABOLIC PANEL
Anion Gap: 6 — ABNORMAL LOW (ref 7–16)
BUN: 10 mg/dL (ref 7–18)
Calcium, Total: 7.8 mg/dL — ABNORMAL LOW (ref 8.5–10.1)
Chloride: 106 mmol/L (ref 98–107)
EGFR (African American): >60
Glucose: 351 mg/dL — ABNORMAL HIGH (ref 65–99)
Osmolality: 291 (ref 275–301)

## 2011-08-09 LAB — CBC WITH DIFFERENTIAL/PLATELET
Basophil #: 0 10*3/uL (ref 0.0–0.1)
Basophil %: 0.5 %
Eosinophil #: 0.1 10*3/uL (ref 0.0–0.7)
HCT: 23.9 % — ABNORMAL LOW (ref 40.0–52.0)
HGB: 7.6 g/dL — ABNORMAL LOW (ref 13.0–18.0)
Lymphocyte #: 0.6 10*3/uL — ABNORMAL LOW (ref 1.0–3.6)
MCH: 24.3 pg — ABNORMAL LOW (ref 26.0–34.0)
MCV: 76 fL — ABNORMAL LOW (ref 80–100)
Platelet: 285 10*3/uL (ref 150–440)
RBC: 3.15 10*6/uL — ABNORMAL LOW (ref 4.40–5.90)
RDW: 27.5 % — ABNORMAL HIGH (ref 11.5–14.5)

## 2011-08-09 LAB — EXPECTORATED SPUTUM ASSESSMENT W GRAM STAIN, RFLX TO RESP C

## 2011-08-09 LAB — MAGNESIUM: Magnesium: 1.7 mg/dL — ABNORMAL LOW

## 2011-08-09 LAB — PHOSPHORUS: Phosphorus: 2.3 mg/dL — ABNORMAL LOW (ref 2.5–4.9)

## 2011-08-10 LAB — BASIC METABOLIC PANEL
Anion Gap: 7 (ref 7–16)
BUN: 12 mg/dL (ref 7–18)
Calcium, Total: 7.9 mg/dL — ABNORMAL LOW (ref 8.5–10.1)
Chloride: 107 mmol/L (ref 98–107)
Co2: 28 mmol/L (ref 21–32)
EGFR (African American): 59 — ABNORMAL LOW
Glucose: 436 mg/dL — ABNORMAL HIGH (ref 65–99)
Osmolality: 302 (ref 275–301)
Potassium: 3.7 mmol/L (ref 3.5–5.1)
Sodium: 142 mmol/L (ref 136–145)

## 2011-08-10 LAB — HEMOGLOBIN: HGB: 7.4 g/dL — ABNORMAL LOW (ref 13.0–18.0)

## 2011-08-10 LAB — PHOSPHORUS: Phosphorus: 3.9 mg/dL (ref 2.5–4.9)

## 2011-08-10 LAB — MAGNESIUM: Magnesium: 1.8 mg/dL

## 2011-08-11 LAB — POTASSIUM
Potassium: 2.9 mmol/L — ABNORMAL LOW (ref 3.5–5.1)
Potassium: 2.9 mmol/L — ABNORMAL LOW (ref 3.5–5.1)
Potassium: 3.3 mmol/L — ABNORMAL LOW (ref 3.5–5.1)

## 2011-08-11 LAB — PHOSPHORUS: Phosphorus: 3.3 mg/dL (ref 2.5–4.9)

## 2011-08-11 LAB — MAGNESIUM: Magnesium: 1.6 mg/dL — ABNORMAL LOW

## 2011-08-11 LAB — CALCIUM: Calcium, Total: 8.5 mg/dL (ref 8.5–10.1)

## 2011-08-11 LAB — ALBUMIN: Albumin: 1.5 g/dL — ABNORMAL LOW (ref 3.4–5.0)

## 2011-08-12 ENCOUNTER — Ambulatory Visit (HOSPITAL_COMMUNITY)
Admission: AD | Admit: 2011-08-12 | Discharge: 2011-08-12 | Disposition: A | Discharge status: Home / Self Care | Payer: Medicare Other | Source: Other Acute Inpatient Hospital | Attending: Internal Medicine | Admitting: Internal Medicine

## 2011-08-12 DIAGNOSIS — J9600 Acute respiratory failure, unspecified whether with hypoxia or hypercapnia: Secondary | ICD-10-CM | POA: Insufficient documentation

## 2011-08-12 LAB — BASIC METABOLIC PANEL
Anion Gap: 7 (ref 7–16)
BUN: 16 mg/dL (ref 7–18)
Calcium, Total: 7.9 mg/dL — ABNORMAL LOW (ref 8.5–10.1)
Chloride: 106 mmol/L (ref 98–107)
Co2: 30 mmol/L (ref 21–32)
EGFR (African American): >60
EGFR (Non-African Amer.): 56 — ABNORMAL LOW
Osmolality: 296 (ref 275–301)
Potassium: 4 mmol/L (ref 3.5–5.1)
Sodium: 143 mmol/L (ref 136–145)

## 2011-08-12 LAB — CBC WITH DIFFERENTIAL/PLATELET
Basophil #: 0.1 10*3/uL (ref 0.0–0.1)
Eosinophil %: 0.5 %
HGB: 8.4 g/dL — ABNORMAL LOW (ref 13.0–18.0)
Lymphocyte %: 10.1 %
MCH: 24.1 pg — ABNORMAL LOW (ref 26.0–34.0)
MCV: 76 fL — ABNORMAL LOW (ref 80–100)
Neutrophil #: 15.8 10*3/uL — ABNORMAL HIGH (ref 1.4–6.5)
Platelet: 276 10*3/uL (ref 150–440)
RBC: 3.49 10*6/uL — ABNORMAL LOW (ref 4.40–5.90)

## 2011-08-12 LAB — MAGNESIUM: Magnesium: 1.9 mg/dL

## 2011-08-24 ENCOUNTER — Encounter (HOSPITAL_COMMUNITY): Payer: Self-pay | Admitting: Internal Medicine

## 2011-08-24 ENCOUNTER — Inpatient Hospital Stay (HOSPITAL_COMMUNITY): Payer: Medicare Other

## 2011-08-24 ENCOUNTER — Ambulatory Visit (HOSPITAL_COMMUNITY): Admission: RE | Admit: 2011-08-24 | Payer: Medicare Other | Source: Ambulatory Visit | Admitting: Internal Medicine

## 2011-08-24 ENCOUNTER — Inpatient Hospital Stay (HOSPITAL_COMMUNITY)
Admission: AD | Admit: 2011-08-24 | Discharge: 2011-09-07 | DRG: 871 | Disposition: E | Payer: Medicare Other | Source: Other Acute Inpatient Hospital | Attending: Internal Medicine | Admitting: Internal Medicine

## 2011-08-24 DIAGNOSIS — A419 Sepsis, unspecified organism: Principal | ICD-10-CM

## 2011-08-24 DIAGNOSIS — Z931 Gastrostomy status: Secondary | ICD-10-CM

## 2011-08-24 DIAGNOSIS — J189 Pneumonia, unspecified organism: Secondary | ICD-10-CM | POA: Diagnosis present

## 2011-08-24 DIAGNOSIS — Z930 Tracheostomy status: Secondary | ICD-10-CM

## 2011-08-24 DIAGNOSIS — L8993 Pressure ulcer of unspecified site, stage 3: Secondary | ICD-10-CM | POA: Diagnosis present

## 2011-08-24 DIAGNOSIS — R6521 Severe sepsis with septic shock: Secondary | ICD-10-CM

## 2011-08-24 DIAGNOSIS — N179 Acute kidney failure, unspecified: Secondary | ICD-10-CM | POA: Diagnosis present

## 2011-08-24 DIAGNOSIS — Z981 Arthrodesis status: Secondary | ICD-10-CM

## 2011-08-24 DIAGNOSIS — L8990 Pressure ulcer of unspecified site, unspecified stage: Secondary | ICD-10-CM

## 2011-08-24 DIAGNOSIS — J9620 Acute and chronic respiratory failure, unspecified whether with hypoxia or hypercapnia: Secondary | ICD-10-CM | POA: Diagnosis present

## 2011-08-24 DIAGNOSIS — L89509 Pressure ulcer of unspecified ankle, unspecified stage: Secondary | ICD-10-CM | POA: Diagnosis present

## 2011-08-24 DIAGNOSIS — K922 Gastrointestinal hemorrhage, unspecified: Secondary | ICD-10-CM | POA: Diagnosis present

## 2011-08-24 DIAGNOSIS — L89109 Pressure ulcer of unspecified part of back, unspecified stage: Secondary | ICD-10-CM | POA: Diagnosis present

## 2011-08-24 DIAGNOSIS — IMO0002 Reserved for concepts with insufficient information to code with codable children: Secondary | ICD-10-CM

## 2011-08-24 DIAGNOSIS — G9340 Encephalopathy, unspecified: Secondary | ICD-10-CM | POA: Diagnosis present

## 2011-08-24 DIAGNOSIS — L8994 Pressure ulcer of unspecified site, stage 4: Secondary | ICD-10-CM | POA: Diagnosis present

## 2011-08-24 DIAGNOSIS — D649 Anemia, unspecified: Secondary | ICD-10-CM

## 2011-08-24 DIAGNOSIS — Z8614 Personal history of Methicillin resistant Staphylococcus aureus infection: Secondary | ICD-10-CM

## 2011-08-24 DIAGNOSIS — L89309 Pressure ulcer of unspecified buttock, unspecified stage: Secondary | ICD-10-CM | POA: Diagnosis present

## 2011-08-24 DIAGNOSIS — D62 Acute posthemorrhagic anemia: Secondary | ICD-10-CM | POA: Diagnosis present

## 2011-08-24 DIAGNOSIS — G8250 Quadriplegia, unspecified: Secondary | ICD-10-CM

## 2011-08-24 DIAGNOSIS — K3189 Other diseases of stomach and duodenum: Secondary | ICD-10-CM

## 2011-08-24 HISTORY — DX: Unspecified injury at C5 level of cervical spinal cord, initial encounter: S14.105A

## 2011-08-24 HISTORY — DX: Pressure ulcer of right buttock, stage 4: L89.314

## 2011-08-24 HISTORY — DX: Quadriplegia, unspecified: G82.50

## 2011-08-24 HISTORY — DX: Respiratory failure, unspecified, unspecified whether with hypoxia or hypercapnia: J96.90

## 2011-08-24 HISTORY — DX: Pressure ulcer of right ankle, stage 3: L89.513

## 2011-08-24 HISTORY — DX: Cardiac arrest, cause unspecified: I46.9

## 2011-08-24 HISTORY — DX: Sepsis, unspecified organism: A41.9

## 2011-08-24 HISTORY — DX: Systemic inflammatory response syndrome (sirs) of non-infectious origin without acute organ dysfunction: R65.10

## 2011-08-24 HISTORY — DX: Tracheostomy status: Z93.0

## 2011-08-24 HISTORY — DX: Pneumonia due to methicillin resistant Staphylococcus aureus: J15.212

## 2011-08-24 HISTORY — DX: Urinary tract infection, site not specified: N39.0

## 2011-08-24 LAB — CARDIAC PANEL(CRET KIN+CKTOT+MB+TROPI)
Relative Index: INVALID (ref 0.0–2.5)
Total CK: 95 U/L (ref 7–232)
Troponin I: <0.3 ng/mL (ref <0.30)

## 2011-08-24 LAB — POCT I-STAT 3, ART BLOOD GAS (G3+)
Acid-base deficit: 1 mmol/L (ref 0.0–2.0)
Acid-base deficit: 1 mmol/L (ref 0.0–2.0)
Acid-base deficit: 3 mmol/L — ABNORMAL HIGH (ref 0.0–2.0)
Bicarbonate: 28.3 mEq/L — ABNORMAL HIGH (ref 20.0–24.0)
Bicarbonate: 29.5 mEq/L — ABNORMAL HIGH (ref 20.0–24.0)
Bicarbonate: 30 mEq/L — ABNORMAL HIGH (ref 20.0–24.0)
O2 Saturation: 93 %
O2 Saturation: 80 %
Patient temperature: 96
Patient temperature: 96
Patient temperature: 97.6
TCO2: 29 mmol/L (ref 0–100)
TCO2: 31 mmol/L (ref 0–100)
pCO2 arterial: 61.3 mmHg (ref 35.0–45.0)
pH, Arterial: 7.076 — CL (ref 7.350–7.450)
pH, Arterial: 7.058 — CL (ref 7.350–7.450)
pO2, Arterial: 75 mmHg — ABNORMAL LOW (ref 80.0–100.0)
pO2, Arterial: 49 mmHg — ABNORMAL LOW (ref 80.0–100.0)
pO2, Arterial: 38 mmHg — CL (ref 80.0–100.0)

## 2011-08-24 LAB — CBC
HCT: 25.8 % — ABNORMAL LOW (ref 39.0–52.0)
HCT: 27.3 % — ABNORMAL LOW (ref 39.0–52.0)
MCH: 23.6 pg — ABNORMAL LOW (ref 26.0–34.0)
MCH: 23.7 pg — ABNORMAL LOW (ref 26.0–34.0)
MCHC: 31.4 g/dL (ref 30.0–36.0)
MCHC: 30.8 g/dL (ref 30.0–36.0)
MCHC: 30.9 g/dL (ref 30.0–36.0)
MCV: 75 fL — ABNORMAL LOW (ref 78.0–100.0)
Platelets: 453 10*3/uL — ABNORMAL HIGH (ref 150–400)
RDW: 25.4 % — ABNORMAL HIGH (ref 11.5–15.5)
RDW: 25.2 % — ABNORMAL HIGH (ref 11.5–15.5)

## 2011-08-24 LAB — BASIC METABOLIC PANEL
BUN: 18 mg/dL (ref 6–23)
Calcium: 8.2 mg/dL — ABNORMAL LOW (ref 8.4–10.5)
Creatinine, Ser: 0.64 mg/dL (ref 0.50–1.35)
GFR calc Af Amer: >90 mL/min (ref >90)
GFR calc non Af Amer: >90 mL/min (ref >90)
Glucose, Bld: 132 mg/dL — ABNORMAL HIGH (ref 70–99)
Potassium: 4.4 mEq/L (ref 3.5–5.1)

## 2011-08-24 LAB — TYPE AND SCREEN
ABO/RH(D): B POS
Antibody Screen: NEGATIVE

## 2011-08-24 LAB — GLUCOSE, CAPILLARY: Glucose-Capillary: 157 mg/dL — ABNORMAL HIGH (ref 70–99)

## 2011-08-24 LAB — LACTIC ACID, PLASMA
Lactic Acid, Venous: 1.2 mmol/L (ref 0.5–2.2)
Lactic Acid, Venous: 1.2 mmol/L (ref 0.5–2.2)

## 2011-08-24 LAB — MRSA PCR SCREENING: MRSA by PCR: POSITIVE — AB

## 2011-08-24 LAB — FIBRINOGEN: Fibrinogen: 651 mg/dL — ABNORMAL HIGH (ref 204–475)

## 2011-08-24 LAB — D-DIMER, QUANTITATIVE: D-Dimer, Quant: 3.58 ug/mL-FEU — ABNORMAL HIGH (ref 0.00–0.48)

## 2011-08-24 LAB — DIFFERENTIAL
Basophils Absolute: 0 10*3/uL (ref 0.0–0.1)
Basophils Relative: 0 % (ref 0–1)
Eosinophils Absolute: 0 10*3/uL (ref 0.0–0.7)
Lymphocytes Relative: 2 % — ABNORMAL LOW (ref 12–46)
Lymphs Abs: 0.5 10*3/uL — ABNORMAL LOW (ref 0.7–4.0)
Monocytes Absolute: 0.5 10*3/uL (ref 0.1–1.0)
Neutro Abs: 22.7 10*3/uL — ABNORMAL HIGH (ref 1.7–7.7)

## 2011-08-24 LAB — URINALYSIS, ROUTINE W REFLEX MICROSCOPIC
Bilirubin Urine: NEGATIVE
Glucose, UA: 500 mg/dL — AB
Hgb urine dipstick: NEGATIVE
Ketones, ur: NEGATIVE mg/dL
Nitrite: NEGATIVE
Specific Gravity, Urine: 1.015 (ref 1.005–1.030)
pH: 5.5 (ref 5.0–8.0)

## 2011-08-24 LAB — COMPREHENSIVE METABOLIC PANEL
ALT: 33 U/L (ref 0–53)
Albumin: 1.6 g/dL — ABNORMAL LOW (ref 3.5–5.2)
Alkaline Phosphatase: 91 U/L (ref 39–117)
BUN: 18 mg/dL (ref 6–23)
Chloride: 101 mEq/L (ref 96–112)
GFR calc Af Amer: >90 mL/min (ref >90)
Glucose, Bld: 258 mg/dL — ABNORMAL HIGH (ref 70–99)
Potassium: 3.9 mEq/L (ref 3.5–5.1)
Total Bilirubin: 0.5 mg/dL (ref 0.3–1.2)

## 2011-08-24 LAB — URINE MICROSCOPIC-ADD ON

## 2011-08-24 LAB — MAGNESIUM: Magnesium: 1.7 mg/dL (ref 1.5–2.5)

## 2011-08-24 LAB — CARBOXYHEMOGLOBIN: Carboxyhemoglobin: 1.6 % — ABNORMAL HIGH (ref 0.5–1.5)

## 2011-08-24 LAB — PROTIME-INR
INR: 1.3 (ref 0.00–1.49)
Prothrombin Time: 16.4 seconds — ABNORMAL HIGH (ref 11.6–15.2)

## 2011-08-24 MED ORDER — NOREPINEPHRINE BITARTRATE 1 MG/ML IJ SOLN
2.0000 ug/min | INTRAVENOUS | Status: DC
  Filled 2011-08-24: qty 16

## 2011-08-24 MED ORDER — SODIUM BICARBONATE 4.2 % IV SOLN: 50.0000 meq | Freq: Once | INTRAVENOUS | Status: DC

## 2011-08-24 MED ORDER — LINEZOLID 2 MG/ML IV SOLN
600.0000 mg | Freq: Two times a day (BID) | INTRAVENOUS | Status: DC
  Administered 2011-08-24: 600 mg via INTRAVENOUS
  Filled 2011-08-24 (×2): qty 300

## 2011-08-24 MED ORDER — SODIUM CHLORIDE 0.9 % IV BOLUS (SEPSIS)
25.0000 mL/kg | Freq: Once | INTRAVENOUS | Status: AC
Start: 2011-08-24 — End: 2011-08-24
  Administered 2011-08-24: 2598 mL via INTRAVENOUS

## 2011-08-24 MED ORDER — SODIUM CHLORIDE 0.9 % IV BOLUS (SEPSIS): 500.0000 mL | Freq: Once | INTRAVENOUS | Status: DC

## 2011-08-24 MED ORDER — SODIUM CHLORIDE 0.9 % IV SOLN
1.0000 mg/h | INTRAVENOUS | Status: DC
  Administered 2011-08-24 (×3): 2 mg/h via INTRAVENOUS
  Filled 2011-08-24 (×3): qty 10

## 2011-08-24 MED ORDER — SODIUM CHLORIDE 0.9 % IV SOLN
500.0000 mg | Freq: Four times a day (QID) | INTRAVENOUS | Status: DC
  Administered 2011-08-24: 500 mg via INTRAVENOUS
  Filled 2011-08-24 (×3): qty 500

## 2011-08-24 MED ORDER — SODIUM BICARBONATE 8.4 % IV SOLN
50.0000 meq | Freq: Once | INTRAVENOUS | Status: DC
  Filled 2011-08-24: qty 50

## 2011-08-24 MED ORDER — NOREPINEPHRINE BITARTRATE 1 MG/ML IJ SOLN
2.0000 ug/min | INTRAVENOUS | Status: DC | PRN
  Filled 2011-08-24: qty 4

## 2011-08-24 MED ORDER — MICAFUNGIN SODIUM 50 MG IV SOLR
100.0000 mg | Freq: Every day | INTRAVENOUS | Status: DC
  Administered 2011-08-24: 100 mg via INTRAVENOUS
  Filled 2011-08-24 (×2): qty 100

## 2011-08-24 MED ORDER — SODIUM BICARBONATE 8.4 % IV SOLN
INTRAVENOUS | Status: DC
  Administered 2011-08-24: 16:00:00 via INTRAVENOUS
  Filled 2011-08-24: qty 100

## 2011-08-24 MED ORDER — SODIUM CHLORIDE 0.45 % IV SOLN: INTRAVENOUS | Status: DC

## 2011-08-24 MED ORDER — SODIUM CHLORIDE 0.9 % IV SOLN: INTRAVENOUS | Status: DC

## 2011-08-24 MED ORDER — DEXTROSE 10 % IV SOLN: INTRAVENOUS | Status: DC | PRN

## 2011-08-24 MED ORDER — SODIUM CHLORIDE 0.9 % IV SOLN
500.0000 mg | Freq: Once | INTRAVENOUS | Status: AC
  Administered 2011-08-24: 500 mg via INTRAVENOUS
  Filled 2011-08-24: qty 500

## 2011-08-24 MED ORDER — MIDAZOLAM HCL 2 MG/2ML IJ SOLN
2.0000 mg | Freq: Once | INTRAMUSCULAR | Status: AC
  Administered 2011-08-24: 2 mg via INTRAVENOUS
  Filled 2011-08-24: qty 2

## 2011-08-24 MED ORDER — ARTIFICIAL TEARS OP OINT
1.0000 "application " | TOPICAL_OINTMENT | Freq: Three times a day (TID) | OPHTHALMIC | Status: DC
  Administered 2011-08-24: 1 via OPHTHALMIC
  Filled 2011-08-24: qty 3.5

## 2011-08-24 MED ORDER — SODIUM BICARBONATE 8.4 % IV SOLN
INTRAVENOUS | Status: AC
  Administered 2011-08-24: 100 meq
  Filled 2011-08-24: qty 100

## 2011-08-24 MED ORDER — INSULIN ASPART 100 UNIT/ML ~~LOC~~ SOLN
0.0000 [IU] | SUBCUTANEOUS | Status: DC
  Administered 2011-08-24 (×2): 2 [IU] via SUBCUTANEOUS

## 2011-08-24 MED ORDER — NOREPINEPHRINE BITARTRATE 1 MG/ML IJ SOLN
2.0000 ug/min | INTRAVENOUS | Status: DC
  Administered 2011-08-24: 15 ug/min via INTRAVENOUS
  Administered 2011-08-24: 10 ug/min via INTRAVENOUS
  Filled 2011-08-24 (×4): qty 4

## 2011-08-24 MED ORDER — DOBUTAMINE IN D5W 4-5 MG/ML-% IV SOLN: 2.5000 ug/kg/min | INTRAVENOUS | Status: DC | PRN

## 2011-08-24 MED ORDER — IOHEXOL 300 MG/ML  SOLN: 20.0000 mL | INTRAMUSCULAR | Status: AC

## 2011-08-24 MED ORDER — CISATRACURIUM BESYLATE 10 MG/ML IV SOLN
3.0000 ug/kg/min | INTRAVENOUS | Status: DC
  Administered 2011-08-24 (×2): 3 ug/kg/min via INTRAVENOUS
  Filled 2011-08-24 (×2): qty 20

## 2011-08-24 MED ORDER — SODIUM CHLORIDE 0.9 % IV SOLN: 250.0000 mL | INTRAVENOUS | Status: DC | PRN

## 2011-08-24 MED ORDER — CIPROFLOXACIN IN D5W 400 MG/200ML IV SOLN
400.0000 mg | Freq: Two times a day (BID) | INTRAVENOUS | Status: DC
  Administered 2011-08-24: 400 mg via INTRAVENOUS
  Filled 2011-08-24 (×2): qty 200

## 2011-08-24 MED ORDER — VANCOMYCIN HCL 1000 MG IV SOLR
2000.0000 mg | Freq: Once | INTRAVENOUS | Status: AC
  Administered 2011-08-24: 2000 mg via INTRAVENOUS
  Filled 2011-08-24: qty 2000

## 2011-08-24 MED ORDER — CISATRACURIUM BOLUS VIA INFUSION
0.0500 mg/kg | Freq: Once | INTRAVENOUS | Status: AC
  Administered 2011-08-24: 5.2 mg via INTRAVENOUS
  Filled 2011-08-24: qty 6

## 2011-08-24 MED ORDER — SODIUM CHLORIDE 0.9 % IV SOLN
10.0000 ug/h | INTRAVENOUS | Status: DC
  Administered 2011-08-24: 50 ug/h via INTRAVENOUS
  Filled 2011-08-24 (×2): qty 50

## 2011-08-24 MED ORDER — PANTOPRAZOLE SODIUM 40 MG IV SOLR
40.0000 mg | Freq: Every day | INTRAVENOUS | Status: DC
  Filled 2011-08-24: qty 40

## 2011-08-24 MED ORDER — STERILE WATER FOR INJECTION IV SOLN: INTRAVENOUS | Status: DC

## 2011-08-24 MED ORDER — SODIUM CHLORIDE 0.9 % IV BOLUS (SEPSIS): 500.0000 mL | INTRAVENOUS | Status: DC | PRN

## 2011-08-24 MED ORDER — FENTANYL CITRATE 0.05 MG/ML IJ SOLN
50.0000 ug | Freq: Once | INTRAMUSCULAR | Status: DC
  Filled 2011-08-24: qty 2

## 2011-08-24 MED ORDER — HYDROCORTISONE SOD SUCCINATE 100 MG IJ SOLR
50.0000 mg | Freq: Four times a day (QID) | INTRAMUSCULAR | Status: DC
  Administered 2011-08-24 (×2): 50 mg via INTRAVENOUS
  Filled 2011-08-24 (×5): qty 1

## 2011-08-24 MED ORDER — SODIUM CHLORIDE 0.9 % IV SOLN
8.0000 mg/h | INTRAVENOUS | Status: DC
  Administered 2011-08-24: 8 mg/h via INTRAVENOUS
  Filled 2011-08-24 (×4): qty 80

## 2011-08-25 LAB — CORTISOL: Cortisol, Plasma: 13.8 ug/dL

## 2011-08-26 LAB — GLUCOSE, CAPILLARY: Glucose-Capillary: 228 mg/dL — ABNORMAL HIGH (ref 70–99)

## 2011-08-26 LAB — CULTURE, RESPIRATORY W GRAM STAIN

## 2011-08-29 LAB — URINE CULTURE
Colony Count: 75000
Culture  Setup Time: 201305180823

## 2011-08-30 LAB — CULTURE, BLOOD (ROUTINE X 2)

## 2011-09-07 ENCOUNTER — Ambulatory Visit: Payer: Self-pay | Admitting: Internal Medicine

## 2011-09-07 NOTE — Progress Notes (Signed)
RT placed patient HFOV Mean 35. FIO2 100%, hertz 4.0. I time 33%, and power of 6.0. RT drew abg in 15 minutes to determine if increasing hypercapnea was present. Called DR. Zubelevitskiy  with abg results and placed patient back on conventional ventilation per his orders.

## 2011-09-07 NOTE — Progress Notes (Signed)
Patient was declared expired at 7:32 pm after no heart sounds was heard on heart auscultation by primary nurse, and RN Alonna Buckler. Family at bedside and next  Of Kin and primary MD informed.
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
## 2011-09-07 NOTE — Progress Notes (Signed)
ANTIBIOTIC CONSULT NOTE - FOLLOW UP  Pharmacy Consult for Primaxin Indication: r/o sepsis, ?HCAP  Allergies  Allergen Reactions  . Banana   . Garlic   . Ibuprofen   . Orange Fruit (Citrus)   . Peanut-Containing Drug Products     Patient Measurements: Height: 6' (182.9 cm) Weight: 229 lb 0.9 oz (103.9 kg) IBW/kg (Calculated) : 77.6   Vital Signs: Temp: 94 F (34.4 C) (05/18 0742) Temp src: Rectal (05/18 0742) BP: 107/62 mmHg (05/18 0900) Pulse Rate: 87  (05/18 1000) Intake/Output from previous day: 05/17 0701 - 05/18 0700 In: 67.5 [I.V.:7.5] Out: -  Intake/Output from this shift: Total I/O In: 3140 [I.V.:2540; IV Piggyback:600] Out: -   Labs:  Basename September 17, 2011 0724  WBC 23.7*  HGB 8.1*  PLT 262  LABCREA --  CREATININE 0.45*   Estimated Creatinine Clearance: 136.1 ml/min (by C-G formula based on Cr of 0.45). No results found for this basename: VANCOTROUGH:2,VANCOPEAK:2,VANCORANDOM:2,GENTTROUGH:2,GENTPEAK:2,GENTRANDOM:2,TOBRATROUGH:2,TOBRAPEAK:2,TOBRARND:2,AMIKACINPEAK:2,AMIKACINTROU:2,AMIKACIN:2, in the last 72 hours   Microbiology: Recent Results (from the past 720 hour(s))  MRSA PCR SCREENING     Status: Abnormal   Collection Time   09-17-2011  4:50 AM      Component Value Range Status Comment   MRSA by PCR POSITIVE (*) NEGATIVE  Final     Anti-infectives     Start     Dose/Rate Route Frequency Ordered Stop   09/17/2011 1400   micafungin (MYCAMINE) 100 mg in sodium chloride 0.9 % 100 mL IVPB        100 mg 100 mL/hr over 1 Hours Intravenous Daily 2011-09-17 1117     2011/09/17 1300   ciprofloxacin (CIPRO) IVPB 400 mg        400 mg 200 mL/hr over 60 Minutes Intravenous Every 12 hours 09/17/2011 1102     2011-09-17 1200   linezolid (ZYVOX) IVPB 600 mg        600 mg 300 mL/hr over 60 Minutes Intravenous Every 12 hours Sep 17, 2011 1102     09-17-2011 0700   imipenem-cilastatin (PRIMAXIN) 500 mg in sodium chloride 0.9 % 100 mL IVPB        500 mg 200 mL/hr over 30  Minutes Intravenous  Once 2011/09/17 0625 Sep 17, 2011 0921   Sep 17, 2011 0630   vancomycin (VANCOCIN) 2,000 mg in sodium chloride 0.9 % 500 mL IVPB        2,000 mg 250 mL/hr over 120 Minutes Intravenous  Once 2011/09/17 0625 09-17-2011 1051          Assessment: 50 yom consulted on primaxin for septic shock. Pt is quadraplegic and obese. WBC 23.7, temp 94. Blood, urine, sputum cultures sent. Noted patient history of pseudomonas UTI and MRSA pna.   Abx include:  zyvox (PNA/MRSA) 5/18>>> Imipenem (septic shock) 5/18>>> cipro (h/o pseudomonas) 5/18>>> micafungin 5/18>>> vanc 5/18 >>5/18 (2g x 1 dose given)   Due to the fact that the patient is quadraplegic, I cannot rely on his estimated CrCl of >127ml/min. Patient is very ill so I will dose primaxin aggressively and pharmacy will need to monitor any changes in Scr. I do not see any documentation of seizure history to warrant change in abx selection.   Goal of Therapy:  resolve infection  Plan:  -Begin primaxin 500mg  IV q6h -closely monitor renal function -f/u blood cultures, abx narrowing, duration of therapy   Thank you,  Brett Fairy, PharmD Pager: [phone]  17-Sep-2011 11:58 AM
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
## 2011-09-07 NOTE — Progress Notes (Signed)
RT placed patient on 8cc VT per ARDS protocol and decreased to 6cc VT per ARDS protocol do to increased Plateau pressure 37-40. After dropping to 6cc plateau at 29. Rt contact DR Bary Richard with results. Will draw abg in 30 minutes and call results to DR [facility] for clarification of ventilator order.

## 2011-09-07 NOTE — Progress Notes (Signed)
Name: [name] [name] MRN: [id number] DOB: 16-Oct-1960  ELECTRONIC ICU PHYSICIAN NOTE  Problem:  Acute respiratory failure, unable to tolerate HFOV, refractory hypoxemia / shock.  Intervention:  Called sister [name] [name] [phone] to update, who asked me to contact her sibling [name] [name] [phone] (medical decision maker for the patient).  Discussed condition, recent developments, prognosis, plan of care.  [name] [name] does not want [name] to undergo CPR since most likely it would not be successful.  Confirmed by bedside RN.  Order placed.  Orlean Bradford, M.D. Pulmonary and Critical Care Medicine [facility] Cell: [phone] Pager: [phone]  08/26/2011, 6:26 PM
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
## 2011-09-07 DEATH — deceased
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
## 2014-07-31 NOTE — Consult Note (Signed)
Pt nurses report that the last bowel movement was black stool.  He is likely not cleaned out enough to do the colonoscopy.  Will do rectal exam later this morning and likely give him clear liquids and another bottle of prep and postpone colon exam to tomorrow.    Electronic Signatures: Scot JunElliott, Robert T (MD)  (Signed on 22-Apr-13 07:02)  Authored  Last Updated: 22-Apr-13 07:02 by Scot JunElliott, Robert T (MD)
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
## 2014-07-31 NOTE — Consult Note (Signed)
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   Patient was seen by consultant.   Recommend to proceed with surgery or procedure.   Recommend further assessment or treatment.   Comments: Full note tomorrow. Await colonoscopy Tuesday and may be able to do wedge gastrectomy Wednesday. Nurses changed VAC today.  Electronic Signatures: Claude MangesMarterre, Jaymeson Mengel F (MD)  (Signed 20-Apr-13 18:07)  Authored: Brief Consult Note   Last Updated: 20-Apr-13 18:07 by Claude MangesMarterre, Oneida Mckamey F (MD)

## 2014-07-31 NOTE — Consult Note (Signed)
Patient with a large submucosal mass on proximal anterior lesser curvature of body of stomach.  Small amount of darkish material seen.  BX done of mass, likely will not yield much due to submucosal nature of mass.  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   Recommend CT of abdomen.  Consider EUS if patient wishes.  Consider exploratory and resection.  Electronic Signatures: Scot JunElliott, Trixie Maclaren T (MD)  (Signed on 20-Apr-13 09:53)  Authored  Last Updated: 20-Apr-13 09:53 by Scot JunElliott, Briani Maul T (MD)

## 2014-07-31 NOTE — Consult Note (Signed)
Brief Consult Note: Diagnosis: anemia, ida, acute and chronic blood loss, chronic anemia.   Patient was seen by consultant.   Recommend further assessment or treatment.   Comments: dictated note to follow. Hx IDA, prior IV Feraheme, chronic blood loss, more acute blood loss recently,also element of chronic disease and anemia, chronic infection. 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 Ferritin 46, but low for chronic inflammation, , serum iron low, mcv low at 62, prior results mcv have not seen greater than 70.    SUGGEST   TRANSFUSIONS IN PLACE FOR TODAY AS PER MEDICINE,  GI TO FOLLOW. IRON WILL NOT RESTORE HGB ACUTELY, ALTHOUGH HGB MAY RESPOND 1 GRAM WITHIN 1 WEEK,.. I SUSPECT MARROW RESPONSE MAY BE BLUNTED BY CHRONIC INFECTION.  WOULD GIVE FERAHEME 510 MG IV TOMORROW OR 4/21. WOULD GIVE MVI WITH 400 MICRGRAMS OF FOLIC ACID PO DAILY. WOULD CHECK EPO LEVEL AS POSSIBLE ROLE OF PROCRIT IN THE FUTURE. WOULD fF/U TO DR Orlie DakinFINNEGAN [facility] IN APPROX 2 WEEKS.  Electronic Signatures: Marin RobertsGittin, Shardea Cwynar G (MD)  (Signed 19-Apr-13 17:24)  Authored: Brief Consult Note   Last Updated: 19-Apr-13 17:24 by Marin RobertsGittin, Malori Myers G (MD)
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
## 2014-07-31 NOTE — Consult Note (Signed)
Colonoscopy to cecum showed no large mass or polyp or fresh bleeding place.  Dark black thick sludge throughout the colon requiring 4L of lavage to visualize the pathway.  Will restart food.  Gastric surgery per Dr. Evette CristalSankar at time of his choosing.  Hgb up to 9.2   Electronic Signatures: Scot JunElliott, Robert T (MD)  (Signed on 23-Apr-13 15:35)  Authored  Last Updated: 23-Apr-13 15:35 by Scot JunElliott, Robert T (MD)

## 2014-07-31 NOTE — Consult Note (Signed)
Pt with anemia, not had a colon in a few years.  Will need surgery for the gastric tumor.  Will want to do colonoscopy tomorrow or Tuesday.  I have conflicts both days but will try to do colon tomorrow with a Golytely prep today. He asked for Dr. Evette CristalSankar as a surgeon so I contacted Dr. Birdie SonsByrnette who is on call for him today.  Will do Golytely prep today for colon tomorrow. Agree with transfusions and would give him another unit today also.  Electronic Signatures: Scot JunElliott, Robert T (MD)  (Signed on 21-Apr-13 09:54)  Authored  Last Updated: 21-Apr-13 09:54 by Scot JunElliott, Robert T (MD)

## 2014-07-31 NOTE — Op Note (Signed)
PATIENT NAME:  [name], [name] [name] DATE OF BIRTH:  1960-11-25  DATE OF PROCEDURE:  08/02/2011  PREOPERATIVE DIAGNOSIS: Gastric wall mass.   POSTOPERATIVE DIAGNOSIS: Gastric wall mass.   OPERATION PERFORMED: Resection of gastric wall mass.   SURGEON: Kathreen CosierS. G. Starr Urias, MD   ANESTHESIA: General.   COMPLICATIONS: None.   ESTIMATED BLOOD LOSS: Approximately 50 mL.   DRAINS: None.   PROCEDURE: The patient was put to sleep in the supine position on the operating table. The abdomen was prepped and draped out as a sterile field. A vertical midline incision was made from the xiphoid down towards the umbilicus and deepened through the layers of the abdominal wall with cautery to control bleeding. On entering the abdominal cavity, there was no fluid encountered. There appeared to be no palpable abnormality in the area of the liver. The stomach on its anterior wall extending from the lesser curvature down had a softball-sized mass which appeared to be pretty much in the wall of the stomach. It did not incorporate the posterior wall of the stomach. After ensuring that there were no other visible or palpable abnormality with careful exposure, resection of the gastric wall mass was performed starting at the end closer to the greater curvature taking care to avoid any incorporation of the posterior wall. The echelon stapler with the blue load was used. A total of six applications were made until this mass was completely excised out. The mass was then opened to reveal that it had a deep ulceration in the middle which likely accounted for his bleeding. The majority of the mass was submucous in location. 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 The staple line was inspected and noted to be intact and free of any bleeding or openings. After ensuring that this  was intact, a small amount of fluid was used to irrigate out the upper abdomen. Sponges and instruments were removed and the abdomen was closed. The peritoneal layer was closed with a running 2-0 Vicryl. The fascia was irrigated out and closed with interrupted figure-of-eight stitches of 0 Prolene. Subcutaneous tissue was also closed with 2-0 Vicryl and the skin closed with staples. All sponge, needle, and instrument counts were reported correct x2 at the end of the procedure. Dry sterile dressing was placed. The patient tolerated the procedure well. He was subsequently extubated without any difficulty and returned to the recovery room in stable condition.   ____________________________ S.Wynona LunaG. Marque Rademaker, MD sgs:drc D: 08/05/2011 09:07:49 ET T: 08/05/2011 10:59:21 ET JOB#: [id number]  cc: Timoteo ExposeS.G. Evette CristalSankar, MD, <Dictator> [facility] Center[facility]PL[facility] Wynona LunaG Militza Devery MD ELECTRONICALLY SIGNED 08/13/2011 9:26
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
## 2014-07-31 NOTE — Consult Note (Signed)
Pt with c7 injury and quadriplegia, severe anemia and upper abd burning pain.  Reviewed case with NP, interviewed and examined patient.  Plan for EGD tomorrow with anesthesia help.  Pt getting blood transfusions today.  Electronic Signatures: Scot JunElliott, Ladene Allocca T (MD)  (Signed on 19-Apr-13 18:52)  Authored  Last Updated: 19-Apr-13 18:52 by Scot JunElliott, Hollie Bartus T (MD)

## 2014-07-31 NOTE — Consult Note (Signed)
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 Heme positive black stool.  Positive epi abd pain/tenderness. Etiology to consider stress ulcer, erosive gastritis/esophagitis/duodentitis/HH with Cameron's erosion/malignancy .   Patient was seen by consultant.   Consult note dictated.   Comments: Severe IDA: getting 4 u PRBC today. VS: 114-162/82. NO CP/SOB now, positive  stomach hurts "all over"x 1 month with poor appetite and self reported weight loss of 15 pounds; "I can tell by looking at me." No n/v/dysphagia. Lungs clear, abd mildly tender. PLAN:  Full liquid diet. NPO MN for EGD in am. [facility] as pt states he has them done alot without any findings-last luminal studies a few years ago by pt report. He stopped his oral iron a few months ago, "trying to figure out what is going on with my sytstem." Denises NSAIDs, "I only use Percocet for pain." Case d/w Dr. Mechele CollinElliott in collaboration of care.  Electronic Signatures: Rowan BlaseMills, Retal Tonkinson Ann (NP)  (Signed 19-Apr-13 15:57)  Authored: Brief Consult Note   Last Updated: 19-Apr-13 15:57 by Rowan BlaseMills, Azzam Mehra Ann (NP)

## 2014-07-31 NOTE — Consult Note (Signed)
Spoke to Dr. Cherylann RatelLateef, Dr. Evette CristalSankar, and patient.  Plan colonoscopy attempt tomorrow with further prep of mag citrate and Dulcolax supp.  Electronic Signatures: Scot JunElliott, Robert T (MD)  (Signed on 22-Apr-13 11:28)  Authored  Last Updated: 22-Apr-13 11:28 by Scot JunElliott, Robert T (MD)

## 2014-07-31 NOTE — Consult Note (Signed)
    Comments   Plan for transfer to [facility] noted. Please call if we can be of any assistance.   Electronic Signatures: Preciliano Castell, Harriett SineNancy (MD)  (Signed 06-May-13 14:20)  Authored: Palliative Care   Last Updated: 06-May-13 14:20 by Marshea Wisher, Harriett SineNancy (MD)
